# Patient Record
Sex: Male | Born: 1955 | Race: White | Hispanic: No | Marital: Married
Health system: Northeastern US, Academic
[De-identification: ages and names within clinical notes are randomized; demographics above are authoritative.]

---

## 2017-11-17 ENCOUNTER — Ambulatory Visit: Admit: 2017-11-17 | Payer: No Typology Code available for payment source

## 2017-11-17 ENCOUNTER — Ambulatory Visit: Admitting: Neurological Surgery

## 2017-11-17 NOTE — Progress Notes (Signed)
* * *        [name]    --- ---    62 Y old Male, DOB: 13-Nov-1955, External MRN: [id number]    Account Number: [id number]    [address] [place], [place]    Home: [phone]    Insurance: NHP OUT IPA Payer ID: PAPER    PCP: Verda Cumins, MD Referring: Verda Cumins, MD External Visit ID:  [id number]    Appointment Facility: Neurosurgery        * * *    11/17/2017  Progress Notes: [name], MD **CHN#:** [id number]    --- ---    ---         **Current Medications**    ---    Taking     * Aspirin 81 MG Tablet Chewable 1 tablet Orally Once a day    ---    * Calcium 600 MG Tablet 1 tablet Orally Twice a day    ---    * Enbrel 50 MG/ML Solution 1 ml Subcutaneous once a week on Friday    ---    * Fish Oil 1200 MG Capsule 1 capsule Orally Once a day    ---    * Gabapentin 300 MG Capsule Oral     ---    * Hydroxychloroquine Sulfate 200 MG Tablet TK 1 T PO Oral Once a day    ---    * PredniSONE 1 MG Tablet 4 tablets Orally Once a day    ---    * Prilosec 20 MG Capsule Delayed Release 1 capsule Orally Once a day    ---    * Tramadol HCl 50 MG Tablet (Schedule IV Drug) TK 1 T PO Q 8 H PRN P 4-6 Oral     ---    * Tylenol     ---    * Vitamin D 1000 UNIT Tablet 1 tablet Orally Once a day    ---    * Medication List reviewed and reconciled with the patient    ---      Past Medical History    ---       Rheumatoid arthritis.        ---       **Surgical History**    ---       Left shoulder    ---    Gallbladder    ---      **Social History**    ---    Tobacco history: Never smoked.      **Allergies**    ---       N.K.D.A.    ---    Forrestine Him Verified]       **Hospitalization/Major Diagnostic Procedure**    ---       Denies Past Hospitalization    ---       **Review of Systems**    ---     _GENERAL_ :    Negative for: weight loss within the past year,, weight gain within the past  year,, chest pain,, irregular heart beats,, heart murmurs,, nausea /  vomiting,, easy bruising,, fever / chills,,  frequent nose bleeds,, hoarse  voice,, frequent headaches,, shortness of breath,, breast discharge,, kidney  disease,, liver disease,, previous anesthesiea problems,, excess bleeding  during or after prior procedure,.    _NEUROLOGY_ :    Positive for: back pain. Negative for: weakness, ringing in the ears,, trouble  with balance and coordination,, hearing loss,, vision loss in one eye or  the  other,, difficulty swallowing,, difficulty swallowing,, double vision,, loss  of smell,.            **Reason for Appointment**    ---       1\. Low back pain and right leg pain    ---       **History of Present Illness**    ---     _NEUROSURGERY_ :    62 year old male presents in initial neurosurgical consultation with lower  back pain and right leg pain for the past six months. The pain is located in  the right buttocks (mostly buttocks), skips the thigh and then goes into the  lateral calf into the plantar surface of the foot to the plantar surface of  his right great toe. He actually tore two tendons in the left ankle getting up  from a rowing machine and was undergoing physical therapy for that. He wore a  boot and his gait was altered. He then developed low back pain and right leg  pain and has been going to physical therapy for that. He was initially  diagnosed with piriformis syndrome until MRI demonstrated the  spondylolisthesis, spinal stenosis, and synovial cyst compressing the right L5  nerve root. He did see a neurosurgeon at [facility] who recommended  fusion surgery. He is here for second opinion. He has history of rheumatoid  arthritis and was started on Enbrel 2-3 months ago. He also takes prednisone.  He has had PT for six months for his low back without relief. He cannot  tolerate NSAIDS due to his being on prednisone for his rheumatoid arthritis.     _Associated Providers_ :    Primary Care Provider Verda Cumins, MD .      **Vital Signs**    ---    Pain scale 5, Wt-lbs 200, BP 161/95, HR 102.        **Physical Examination**    ---     _NEUROSURGERY_ :    MOTOR Lower Extremities : 5/5 strength in the bilateral lower extremities  except:, Right EHL 4/5. Reflexes Left Knee Jerk 2+, Right Knee Jerk 2+, Left  Ankle Jerk 2+, Right Ankle Jerk 2+. Gait : uses a cane for assistance.  Babinski : Negative. Ankle Clonus : Negative.    _DIAGNOSTIC STUDIES_ :    RADIOLOGY I reviewed ap/lateral/flexion/extension xrays completed 11/17/17 at  [facility].Marland Kitchen MRI Uploaded to PACS. I reviewed MRI lumbar spine perfomed on 10/14/17  at [facility].          **Assessments**    ---    1\. Spondylolisthesis of lumbosacral region - M43.17 (Primary)    ---    2\. 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 There is also a synovial cyst  at right L5-S1 as well causing nerve compression. He has some weakness in the  left EHL as well. I discussed options with him including continued PT,  epidural steroid injections or surgical intervention. I believe that he would  benefit symptomatically from right L5-S1 TLIF (transforaminal lumbar interbody  fusion).    Dr. Melvenia Beam Helfgott informed patient that he would need to be off Enbrel for  one week prior to surgery and two weeks postoperatively. He told him that as  long as he is on less than 5 mg he can remain on prednisone throughout the  perioperative period.    His daughter is getting married on 02/20/18 in [place]. I recommended that he  undergo a lumbar epidural steroid injection in the meantime for his severe  right leg pain. I will refer him to Dr. Reece Leader in [place] for epidural  steroid injection. He will followup at least ten days after the injection to  see if it helps. If it doesn't will consider surgery.    ---       **Follow Up**    ---    after ESI    Electronically signed by [name] , MD on 11/17/2017 at 06:20 PM EDT    Sign off status: Completed        * *  *        Neurosurgery    [address] [place], [address]    [place], [place] [address]    Tel: [phone]    Fax: [phone]              * * *          Patient: BLAYDEN, CONWELL DOB: 1955-09-27 Progress Note: [name], MD  11/17/2017    ---    Note generated by eClinicalWorks EMR/PM Software ([email or web address])

## 2017-11-17 NOTE — Progress Notes (Signed)
.  Progress Notes  .  Patient: [name] [name]  Provider: [name] I   .  DOB: 1956/03/04 Age: 62 Y Sex: Male  .  PCP: Verda Cumins MD  Date: 11/17/2017  .  --------------------------------------------------------------------------------  .  REASON FOR APPOINTMENT  .  1. Low back pain and right leg pain  .  HISTORY OF PRESENT ILLNESS  .  NEUROSURGERY:  62 year old male presents in initial  neurosurgical consultation with lower back pain and right leg  pain for the past six months. The pain is located in the right  buttocks (mostly buttocks), skips the thigh and then goes into  the lateral calf into the plantar surface of the foot to the  plantar surface of his right great toe. He actually tore two  tendons in the left ankle getting up from a rowing machine and  was undergoing physical therapy for that. He wore a boot and his  gait was altered. He then developed low back pain and right leg  pain and has been going to physical therapy for that. He was  initially diagnosed with piriformis syndrome until MRI  demonstrated the spondylolisthesis, spinal stenosis, and synovial  cyst compressing the right L5 nerve root. He did see a  neurosurgeon at [facility] who recommended fusion  surgery. He is here for second opinion. He has history of  rheumatoid arthritis and was started on Enbrel 2-3 months ago. He  also takes prednisone. He has had PT for six months for his low  back without relief. He cannot tolerate NSAIDS due to his being  on prednisone for his rheumatoid arthritis.  .  Associated Providers:  Primary Care Provider  Verda Cumins, MD .  .  CURRENT MEDICATIONS  .  Taking Aspirin 81 MG Tablet Chewable 1 tablet Orally Once a day  Taking Calcium 600 MG Tablet 1 tablet Orally Twice a day  Taking Enbrel 50 MG/ML Solution 1 ml Subcutaneous once a week on  Friday  Taking Fish Oil 1200 MG Capsule 1 capsule Orally Once a day  Taking Gabapentin 300 MG Capsule Oral  Taking Hydroxychloroquine  Sulfate 200 MG Tablet TK 1 T PO Oral  Once a day  Taking PredniSONE 1 MG Tablet 4 tablets Orally Once a day  Taking Prilosec 20 MG Capsule Delayed Release 1 capsule Orally  Once a day  Taking Tramadol HCl 50 MG Tablet (Schedule IV Drug) TK 1 T PO Q 8  H PRN P 4-6 Oral  Taking Tylenol  Taking Vitamin D 1000 UNIT Tablet 1 tablet Orally Once a day  Medication List reviewed and reconciled with the patient  .  PAST MEDICAL HISTORY  .  Rheumatoid arthritis  .  ALLERGIES  .  N.K.D.A.  .  SURGICAL HISTORY  .  Left shoulder  Gallbladder  .  SOCIAL HISTORY  .  .  Tobaccohistory:Never smoked.  Marland Kitchen  HOSPITALIZATION/MAJOR DIAGNOSTIC PROCEDURE  .  Denies Past Hospitalization  .  REVIEW OF SYSTEMS  .  GENERAL:  .  Negative for:    weight loss within the past year,, weight gain  within the past year,, chest pain,, irregular heart beats,, heart  murmurs,, nausea / vomiting,, easy bruising,, fever / chills,,  frequent nose bleeds,, hoarse voice,, frequent headaches,,  shortness of breath,, breast discharge,, kidney disease,, liver  disease,, previous anesthesiea problems,, excess bleeding during  or after prior procedure, .  .  NEUROLOGY:  .  Positive for:  back pain . Negative for:    weakness, ringing  in the ears,, trouble with balance and coordination,, hearing  loss,, vision loss in one eye or the other,, difficulty  swallowing,, difficulty swallowing,, double vision,, loss of  smell, .  .  VITAL SIGNS  .  Pain scale 5, Wt-lbs 200, BP 161/95, HR 102.  Marland Kitchen  PHYSICAL EXAMINATION  .  NEUROSURGERY:  MOTOR Lower Extremities  : 5/5 strength in the bilateral lower  extremities except:, Right EHL 4/5. :5/5 strength in the  bilateral lower extremities except: , Right EHL4/5. Reflexes   Left Knee Jerk 2+, Right Knee Jerk 2+, Left Ankle Jerk 2+, Right  Ankle Jerk 2+. Left Knee Jerk2+ , Right Knee Jerk2+ , Left Ankle  Jerk2+ , Right Ankle Jerk2+. Gait  : uses a cane for assistance.  :uses a cane for assistance. Babinski  : Negative.  :Negative.  Ankle Clonus  : Negative. :Negative.  DIAGNOSTIC STUDIES:  RADIOLOGY  I reviewed ap/lateral/flexion/extension xrays  completed 11/17/17 at [facility].Marland Kitchen MRI  Uploaded to PACS. I reviewed  MRI lumbar spine perfomed on 10/14/17 at [facility].  .  ASSESSMENTS  .  Spondylolisthesis of lumbosacral region - M43.17 (Primary)  .  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  MRI lumbar spine demonstrates Grade II L5-S1 spondylolisthesis  causing severe compression of the right L5 nerve root. There is  also a synovial cyst at right L5-S1 as well causing nerve  compression. He has some weakness in the left EHL as well. I  discussed options with him including continued PT, epidural  steroid injections or surgical intervention. I believe that he  would benefit symptomatically from right L5-S1 TLIF  (transforaminal lumbar interbody fusion). Dr. Melvenia Beam Helfgott  informed patient that he would need to be off Enbrel for one week  prior to surgery and two weeks postoperatively. He told him that  as long as he is on less than 5 mg he can remain on prednisone  throughout the perioperative period. His daughter is getting  married on 02/20/18 in [place]. I recommended that he undergo a  lumbar epidural steroid injection in the meantime for his severe  right leg pain. I will refer him to Dr. Reece Leader in [place] for  epidural steroid injection. He will followup at least ten days  after the injection to see if it helps. If it doesn't will  consider surgery.  .  FOLLOW UP  .  after ESI  .  Electronically signed by [name] , MD on  11/17/2017 at 06:20 PM EDT  .  Document electronically signed by Dionisio David, RON I   .

## 2017-11-18 ENCOUNTER — Ambulatory Visit

## 2017-12-08 ENCOUNTER — Ambulatory Visit

## 2017-12-08 ENCOUNTER — Ambulatory Visit: Admitting: Specialist

## 2017-12-08 NOTE — Progress Notes (Signed)
.  Progress Notes  .  Patient: [name] [name]  Provider: Lorna Few    .  DOB: 1956-02-19 Age: 62 Y Sex: Male  .  PCP: Verda Cumins MD  Date: 12/08/2017  .  --------------------------------------------------------------------------------  .  REASON FOR APPOINTMENT  .  1. Spondylolisthesis of lumbosacral region  .  HISTORY OF PRESENT ILLNESS  .   :   [name] presents in initial consultation with  lower back pain and right leg pain for the past six months. The  pain is located mostly in the right buttocks , skips the thigh  and then goes into the lateral calf into the plantar surface of  the foot to the plantar surface of his right great toe. He  actually tore two tendons in the left ankle getting up from a  rowing machine and was undergoing physical therapy for that. He  wore a boot and his gait was altered. He then developed low back  pain and right leg pain and has been going to physical therapy  for that. He was initially diagnosed with piriformis syndrome  until MRI L-spine demonstrated the spondylolisthesis, spinal  stenosis, and synovial cyst compressing the right L5 nerve rootHe  did some PT with no improvement He was given gabapentin by PCP  for sleep at night but only took it few times ( just filled Rx)  and not constant.  .  CURRENT MEDICATIONS  .  Taking Aspirin 81 MG Tablet Chewable 1 tablet Orally Once a day  Taking Calcium 600 MG Tablet 1 tablet Orally Twice a day  Taking Enbrel 50 MG/ML Solution 1 ml Subcutaneous once a week on  Friday  Taking Fish Oil 1200 MG Capsule 1 capsule Orally Once a day  Taking Gabapentin 300 MG Capsule Oral , Notes: takes only when he  cannot sleept  Taking Hydroxychloroquine Sulfate 200 MG Tablet TK 1 T PO Oral  Once a day  Taking PredniSONE 1 MG Tablet 4 tablets Orally Once a day  Taking Prilosec 20 MG Capsule Delayed Release 1 capsule Orally  Once a day  Taking Tylenol  Taking Vitamin D 1000 UNIT Tablet 1 tablet Orally Once a  day  Not-Taking/PRN Tramadol HCl 50 MG Tablet (Schedule IV Drug) TK 1  T PO Q 8 H PRN P 4-6 Oral  Medication List reviewed and reconciled with the patient  .  PAST MEDICAL HISTORY  .  Rheumatoid arthritis  .  ALLERGIES  .  N.K.D.A.  .  SURGICAL HISTORY  .  Left shoulder  Gallbladder  .  FAMILY HISTORY  .  No Family History documented.  .  SOCIAL HISTORY  .  .  Tobaccohistory:Never smoked.  Marland Kitchen  HOSPITALIZATION/MAJOR DIAGNOSTIC PROCEDURE  .  No Hospitalization History.  Marland Kitchen  REVIEW OF SYSTEMS  .  Pain Management:  .  Constitutional:    Denies fever, chills . Cardiac:    Denies  chest pain . Pulmonary:    Denies shortness of breath . GI:     Denies bowel dysfunction . GU:    Denies bladder dysfunction .  Hematologic:    Denies anticoagulant therapy . Neurologic:     Denies numbness or weakness .  Marland Kitchen  VITAL SIGNS  .  Pain scale 6, Wt-lbs 191.2, BP 142/82, HR 57, Wt-kg 86.73, Wt  Change -8.8 lb, Temp 98.1, Oxygen sat % 95.  Marland Kitchen  PHYSICAL EXAMINATION  .  Pain Management:  Mental Status:  Speech, language, affect  and cognition are  normal. Motor:  There is normal muscle tone. Strength is 5/5 in  the deltoids, biceps, brachioradialis, triceps, grip strength,  intrinsic hand muscles, iliopsoas, quadriceps, anterior tibialis,  EHL, foot everters and gastrocnemius. Sensory:  Sensation is  intact to light touch in all sensory dermatomes in upper and  lower extremities. Reflexes:  The biceps, triceps, knee reflexes  are 2+ bilaterally and symmetric, left ankel deferred , right  0-1. Gait:  antalgic, using a cane. Musculoskeletal Lumbar Spine:   Moderate tenderness to palpation in right lumbar paravertebral  musculature. Good ROM of lumbar spine but with pain with forward  flexion, extension, lateral flexion. Positive right straight leg  raising. No pain to palpation over SI joints. No gross asymmetric  muscular atrophy noted in lower extremities. Good ROM noted in  all joints in lower extremities.  .  ASSESSMENTS  .  Right lumbar  radiculopathy - M54.16 (Primary)  .  TREATMENT  .  Right lumbar radiculopathy  Notes: [name] with right lumbar radiculopathy -  options of addressing his pain discussed. He is planning surgery  in the fall but would like to wait until after his daughter's  weddingRestart gabapentin ( as he has medication at home, not  sure the dosage ) - at 300 mg at bedtime . Goals and expectations  along with possible side effects discussedLESI discussed in  detail and planned ( at [facility]) .  Marland Kitchen  FOLLOW UP  .  2 Weeks (Reason: L5S1 ESI ( right paramedian approach ) )  .  Electronically signed by Lorna Few , MD on  12/08/2017 at 11:57 AM EDT  .  Document electronically signed by Lorna Few    .

## 2017-12-08 NOTE — Progress Notes (Signed)
* * *        [name] [name]    --- ---    62 Y old Male, DOB: July 16, 1955, External MRN: [id number]    Account Number: [id number]    [address], [name], [place]    Home: [phone]    Insurance: NHP OUT IPA    PCP: [name] Cumins, MD Referring: [name] Cumins, MD    Appointment Facility: [facility]        * * *    12/08/2017  Progress Notes: [name] [name]. [name] Few, MD **CHN#:** [id number]    --- ---    ---         **Reason for Appointment**    ---       1\. Spondylolisthesis of lumbosacral region    ---       **History of Present Illness**    ---     __ :    8 62 yo gentleman presents in initial consultation with lower back pain  and right leg pain for the past six months. The pain is located mostly in the  right buttocks , skips the thigh and then goes into the lateral calf into the  plantar surface of the foot to the plantar surface of his right great toe. He  actually tore two tendons in the left ankle getting up from a rowing machine  and was undergoing physical therapy for that. He wore a boot and his gait was  altered. He then developed low back pain and right leg pain and has been going  to physical therapy for that. He was initially diagnosed with piriformis  syndrome until MRI L-spine demonstrated the spondylolisthesis, spinal  stenosis, and synovial cyst compressing the right L5 nerve root    He did some PT with no improvement    He was given gabapentin by PCP for sleep at night but only took it [name] times (  just filled Rx) and not constant.       **Current Medications**    ---    Taking     * Aspirin 81 MG Tablet Chewable 1 tablet Orally Once a day    ---    * Calcium 600 MG Tablet 1 tablet Orally Twice a day    ---    * Enbrel 50 MG/ML Solution 1 ml Subcutaneous once a week on Friday    ---    * Fish Oil 1200 MG Capsule 1 capsule Orally Once a day    ---    * Gabapentin 300 MG Capsule Oral , Notes: takes only when he cannot sleept    ---    * Hydroxychloroquine Sulfate 200  MG Tablet TK 1 T PO Oral Once a day    ---    * PredniSONE 1 MG Tablet 4 tablets Orally Once a day    ---    * Prilosec 20 MG Capsule Delayed Release 1 capsule Orally Once a day    ---    * Tylenol     ---    * Vitamin D 1000 UNIT Tablet 1 tablet Orally Once a day    ---    Not-Taking/PRN    * Tramadol HCl 50 MG Tablet (Schedule IV Drug) TK 1 T PO Q 8 H PRN P 4-6 Oral     ---    * Medication List reviewed and reconciled with the patient    ---       **Past Medical History**    ---  Rheumatoid arthritis.        ---       **Surgical History**    ---       Left shoulder    ---    Gallbladder    ---      **Family History**    ---       No Family History documented.    ---       **Social History**    ---    Tobacco  history: Never smoked.      **Allergies**    ---       N.K.D.A.    ---       **Hospitalization/Major Diagnostic Procedure**    ---       No Hospitalization History.    ---       **Review of Systems**    ---     _Pain Management_ :    Constitutional: Denies fever, chills. Cardiac: Denies chest pain. Pulmonary:  Denies shortness of breath. GI: Denies bowel dysfunction. GU: Denies bladder  dysfunction. Hematologic: Denies anticoagulant therapy. Neurologic: Denies  numbness or weakness.          **Vital Signs**    ---    Pain scale 6, Wt-lbs 191.2, BP 142/82, HR 57, Wt-kg 86.73, Wt Change -8.8 lb,  Temp 98.1, Oxygen sat % 95.       **Physical Examination**    ---     _Pain Management_ :    Mental Status: Speech, language, affect and cognition are normal. Motor: There  is normal muscle tone. Strength is 5/5 in the deltoids, biceps,  brachioradialis, triceps, grip strength, intrinsic hand muscles, iliopsoas,  quadriceps, anterior tibialis, EHL, foot everters and gastrocnemius. Sensory:  Sensation is intact to light touch in all sensory dermatomes in upper and  lower extremities. Reflexes: The biceps, triceps, knee reflexes are 2+  bilaterally and symmetric, left ankel deferred , right 0-1. Gait:  antalgic,  using a cane. Musculoskeletal Lumbar Spine: Moderate tenderness to palpation  in right lumbar paravertebral musculature. Good ROM of lumbar spine but with  pain with forward flexion, extension, lateral flexion. Positive right straight  leg raising. No pain to palpation over SI joints. No gross asymmetric muscular  atrophy noted in lower extremities. Good ROM noted in all joints in lower  extremities.          **Assessments**    ---    1\. Right lumbar radiculopathy - M54.16 (Primary)    ---       **Treatment**    ---       **1\. Right lumbar radiculopathy**    Notes: [name] with right lumbar radiculopathy - options of  addressing his pain discussed. He is planning surgery in the fall but would  like to wait until after his daughter's wedding    Restart gabapentin ( as he has medication at home, not sure the dosage ) - at  300 mg at bedtime . Goals and expectations along with possible side effects  discussed    LESI discussed in detail and planned ( at [facility])    .    ---      **Follow Up**    ---    2 Weeks (Reason: L5S1 ESI ( right paramedian approach ) )    Electronically signed by [name] [name] , MD on 12/08/2017 at 11:57 AM EDT    Sign off status: Completed        * * *  [facility]    [id number]    [place], [place] [address]    Tel: [phone]    Fax: [phone]              * * *          Patient: [name] [name], [name] [name] DOB: [birth year] Progress Note: [name] [name]. [name] Few, MD 12/08/2017    ---    Note generated by eClinicalWorks EMR/PM Software ([email or web address])

## 2017-12-08 NOTE — Progress Notes (Signed)
* * *        **  [name] [name]**    --- ---    42 Y old Male, DOB: 1956/01/03    [address], [place], [place] [address]    Home: [phone]    Provider: Lorna Few        * * *    Telephone Encounter    ---    Answered by   Lorna Few  Date: 12/08/2017         Time: 11:59 AM    Message                      Asencion Islam , could you please schedule [name] for a LESI in [facility] at the end of the month       Best phone [phone]      Thank you!        --- ---            Action Taken                      Carold Eisner,ADRIANA  12/08/2017 12:00:09 PM >       Deloney,Marva  12/08/2017 1:31:39 PM > Appt scheduled for 12/22/17 at 9:30 am - voice mail message left.                    * * *                ---          * * *          Patient: [name] [name] DOB: 21-Jul-1955 Provider: Lorna Few  12/08/2017    ---    Note generated by eClinicalWorks EMR/PM Software ([email or web address])

## 2017-12-09 ENCOUNTER — Ambulatory Visit

## 2017-12-22 ENCOUNTER — Ambulatory Visit: Admitting: Specialist

## 2017-12-22 NOTE — Progress Notes (Signed)
.  Progress Notes  .  Patient: [name] [name]  Provider: Lorna Few    .  DOB: 12/26/1955 Age: 62 Y Sex: Male  .  PCP: Verda Cumins MD  Date: 12/22/2017  .  --------------------------------------------------------------------------------  .  REASON FOR APPOINTMENT  .  1. MAD LESI PER DR DESILLIER  .  HISTORY OF PRESENT ILLNESS  .  GENERAL:   [name] here today for planned LESI to address  his radicular pain .Details and expectations discussed again .He  is planning surgery but not until fall ( after his daughter's  wedding ).  .  CURRENT MEDICATIONS  .  Taking Aspirin 81 MG Tablet Chewable 1 tablet Orally Once a day  Taking Calcium 600 MG Tablet 1 tablet Orally Twice a day  Taking Enbrel 50 MG/ML Solution 1 ml Subcutaneous once a week on  Friday  Taking Fish Oil 1200 MG Capsule 1 capsule Orally Once a day  Taking Gabapentin 300 MG Capsule Oral , Notes: takes only when he  cannot sleept  Taking Hydroxychloroquine Sulfate 200 MG Tablet TK 1 T PO Oral  Once a day  Taking PredniSONE 1 MG Tablet 4 tablets Orally Once a day  Taking Prilosec 20 MG Capsule Delayed Release 1 capsule Orally  Once a day  Taking Tylenol  Taking Vitamin D 1000 UNIT Tablet 1 tablet Orally Once a day  Not-Taking/PRN Tramadol HCl 50 MG Tablet (Schedule IV Drug) TK 1  T PO Q 8 H PRN P 4-6 Oral  .  ALLERGIES  .  yes[Allergies Verified]  .  VITAL SIGNS  .  Wt-lbs 190.26, BP 140/83, Wt-kg 86.3, Pulse sitting 56.  Marland Kitchen  PHYSICAL EXAMINATION  .  Pain Management:  Pre-Procedure Exam:  no erythema noted at injection site.  .  ASSESSMENTS  .  Right lumbar radiculopathy - M54.16 (Primary)  .  PROCEDURES  .  Lumbar ESI     LESI #1 .Details of the procedure as well as risks and  benefits were discussed with patient at length. Written informed  consent was obtained. Standard monitors were applied and the  patient was monitored and stable all throughout the procedure.  With the patient in the prone position, the backwas prepped  with  chlorhexidine and draped in the usual sterile fashion. The L5-S1  interspace was identified fluoroscopically and 1% Lidocaine was  infiltrated into the skin right paramedian for local anesthesia.  A 20 gauge epidural needle was introduced and gradually advanced  under intermittent fluoroscopy guidance towards prior identified  target area. When the needle was felt to be in the ligamentym  flavum loss of resistance to saline technique was introduced .  The epidural space was accessed at 6 cm and proper needle  positioning was reconfirmed with lateral flouroscopy view.A  solution 2 cc of Triamcinolone (80 mg) , 1 cc 0.25% Bupivacaine  and 2 cc PFNS was then injected into the epidural space after  negative aspiration. The needle was removed. The patient  tolerated the procedure well. There was no evidence of  cerebrospinal fluid, paresthesia or heme. Patient was monitored  for additional 5-10 minutes, remained hemodinamically and  neurologically stable and was discharged to home with a ride.  Written and verbal instructions were given to the patient prior  to depart from the clinic.  Marland Kitchen  PROCEDURE CODES  .  [address] INJ INC NEEDLE CATH PLACE,INTERLAM  EPIDUR/SUBARAC,LUMB/SACRAL;W/ IMAGE GUIDE  .  8170 PAI TRIAMCINOLONE ACET INJ 10MG  - J3301,  Units: 8.00  .  FOLLOW UP  .  3 Weeks (Reason: follow up in [facility])  .  Electronically signed by Lorna Few , MD on  12/22/2017 at 09:42 AM EDT  .  Document electronically signed by Lorna Few    .

## 2017-12-22 NOTE — Progress Notes (Signed)
* * *        [name] [name]    --- ---    62 Y old Male, DOB: April 25, 1956, External MRN: [id number]    Account Number: [id number]    [name], [place], [place]    Home: [phone]    Insurance: NHP OUT IPA    PCP: Verda Cumins, MD Referring: Verda Cumins, MD    Appointment Facility: [facility]        * * *    12/22/2017  Progress Notes: [name] [name]. [name] Few, MD **CHN#:** [id number]    --- ---    ---         **Reason for Appointment**    ---       1\. MAD LESI PER DR Lulamae Skorupski    ---       **History of Present Illness**    ---     _GENERAL_ :    [name] here today for planned LESI to address his radicular  pain .Details and expectations discussed again .He is planning surgery but not  until fall ( after his daughter's wedding ).       **Current Medications**    ---    Taking     * Aspirin 81 MG Tablet Chewable 1 tablet Orally Once a day    ---    * Calcium 600 MG Tablet 1 tablet Orally Twice a day    ---    * Enbrel 50 MG/ML Solution 1 ml Subcutaneous once a week on Friday    ---    * Fish Oil 1200 MG Capsule 1 capsule Orally Once a day    ---    * Gabapentin 300 MG Capsule Oral , Notes: takes only when he cannot sleept    ---    * Hydroxychloroquine Sulfate 200 MG Tablet TK 1 T PO Oral Once a day    ---    * PredniSONE 1 MG Tablet 4 tablets Orally Once a day    ---    * Prilosec 20 MG Capsule Delayed Release 1 capsule Orally Once a day    ---    * Tylenol     ---    * Vitamin D 1000 UNIT Tablet 1 tablet Orally Once a day    ---    Not-Taking/PRN    * Tramadol HCl 50 MG Tablet (Schedule IV Drug) TK 1 T PO Q 8 H PRN P 4-6 Oral     ---      **Vital Signs**    ---    Wt-lbs 190.26, BP 140/83, Wt-kg 86.3, Pulse sitting 56.       **Physical Examination**    ---     _Pain Management_ :    Pre-Procedure Exam: no erythema noted at injection site.          **Assessments**    ---    1\. Right lumbar radiculopathy - M54.16 (Primary)    ---       **Procedures**    ---     Salli Real  ESI_ :    LESI #1 .Details of the procedure as well as risks and benefits were discussed  with patient at length. Written informed consent was obtained. Standard  monitors were applied and the patient was monitored and stable all throughout  the procedure. With the patient in the prone position, the backwas prepped  with chlorhexidine and draped in the usual sterile fashion. The L5-S1  interspace was  identified fluoroscopically and 1% Lidocaine was infiltrated  into the skin right paramedian for local anesthesia. A 20 gauge epidural  needle was introduced and gradually advanced under intermittent fluoroscopy  guidance towards prior identified target area. When the needle was felt to be  in the ligamentym flavum loss of resistance to saline technique was introduced  . The epidural space was accessed at 6 cm and proper needle positioning was  reconfirmed with lateral flouroscopy view.A solution 2 cc of Triamcinolone (80  mg) , 1 cc 0.25% Bupivacaine and 2 cc PFNS was then injected into the epidural  space after negative aspiration. The needle was removed. The patient tolerated  the procedure well. There was no evidence of cerebrospinal fluid, paresthesia  or heme. Patient was monitored for additional 5-10 minutes, remained  hemodinamically and neurologically stable and was discharged to home with a  ride. Written and verbal instructions were given to the patient prior to  depart from the clinic..          **Procedure Codes**    ---       [address] INJ INC NEEDLE CATH PLACE,INTERLAM EPIDUR/SUBARAC,LUMB/SACRAL;W/  IMAGE GUIDE    ---    8170 PAI TRIAMCINOLONE ACET INJ 10MG  - J3301, Units: 8.00    ---      **Follow Up**    ---    3 Weeks (Reason: follow up in [facility]PC)    Electronically signed by [name] [name] , MD on 12/22/2017 at 09:42 AM EDT    Sign off status: Completed        * * *        [name]    [address]    [address]    [place], [place] [address]    Tel: [phone]    Fax: [phone]              * * *           Patient: [name] [name], [name] [name] DOB: 64-Feb-1957 Progress Note: [name] [name]. [name] Few, MD 64/25/2019    ---    Note generated by eClinicalWorks EMR/PM Software ([email or web address])

## 2018-01-12 ENCOUNTER — Ambulatory Visit: Admitting: Specialist

## 2018-01-12 ENCOUNTER — Ambulatory Visit

## 2018-01-12 NOTE — Progress Notes (Signed)
* * *        **  [name] [name]**    --- ---    56 Y old Male, DOB: 1955-07-26    [address] [place], [place] [address]    Home: [phone]    Provider: Lorna Few        * * *    Telephone Encounter    ---    Answered by   Lorna Few  Date: 01/12/2018         Time: 11:22 AM    Reason   injection appt    --- ---            Message                      Asencion Islam, could you please schedule [name] at [place] next week 9 8/22 ) for bilateral MBb 2 levels. Thank you!                Action Taken                      Aloha Bartok,ADRIANA  01/12/2018 11:22:58 AM >       Correia,Jennifer  01/16/2018 1:44:57 PM > patient call back looking to see if this was confirmed or not can someone please call him? cb#[phone]      Correia,Jennifer  01/18/2018 2:04:16 PM > pt called again, new encounter sent to [facility].                     * * *                ---          * * *          Patient: [name] [name] DOB: 30-Jun-1955 Provider: Lorna Few  01/12/2018    ---    Note generated by eClinicalWorks EMR/PM Software ([email or web address])

## 2018-01-12 NOTE — Progress Notes (Signed)
* * *        [name] [name]    --- ---    62 Y old Male, DOB: 25-Sep-1955, External MRN: [id number]    Account Number: [id number]    [address] [place], [place]    Home: [phone]    Insurance: NHP OUT IPA    PCP: Verda Cumins, MD Referring: Verda Cumins, MD    Appointment Facility: [facility]        * * *    01/12/2018  Progress Notes: [name]. Lorna Few, MD **CHN#:** [id number]    --- ---    ---         **Reason for Appointment**    ---       1\. Right lumbar radiculopathy    ---       **History of Present Illness**    ---     _GENERAL_ :    62 yo gentleman with low back and radicular pain , underwent LESI about 3  weeks ago with complete resolution of his radicular pain ( and improvement in  the known ankle pain and swelling)    He has some low back , axial pain that seems facet mediated and I offered him  MBbs.       **Current Medications**    ---    Taking     * Aspirin 81 MG Tablet Chewable 1 tablet Orally Once a day    ---    * Calcium 600 MG Tablet 1 tablet Orally Twice a day    ---    * Enbrel 50 MG/ML Solution 1 ml Subcutaneous once a week on Friday    ---    * Fish Oil 1200 MG Capsule 1 capsule Orally Once a day    ---    * Gabapentin 300 mg Capsule 1 capsule Oral hs, Notes: takes only when he cannot sleept    ---    * Hydroxychloroquine Sulfate 200 MG Tablet TK 1 T PO Oral Once a day    ---    * PredniSONE 1 MG Tablet 4 tablets Orally Once a day, Notes: alternates 4/5 mg qod    ---    * Prilosec 20 MG Capsule Delayed Release 1 capsule Orally Once a day    ---    * Vitamin D 1000 UNIT Tablet 1 tablet Orally Once a day    ---    Not-Taking/PRN    * Tramadol HCl 50 MG Tablet (Schedule IV Drug) TK 1 T PO Q 8 H PRN P 4-6 Oral     ---    * Tylenol     ---    * Medication List reviewed and reconciled with the patient    ---       **Past Medical History**    ---       Rheumatoid arthritis.        ---       **Surgical History**    ---       Left shoulder    ---    Gallbladder     ---      **Family History**    ---       No Family History documented.    ---       **Social History**    ---    Tobacco  history: Never smoked.      **Allergies**    ---       N.K.D.A.    ---       **  Hospitalization/Major Diagnostic Procedure**    ---       No Hospitalization History.    ---       **Review of Systems**    ---     _Pain Management_ :    Constitutional: Denies fever, chills. Cardiac: Denies chest pain. Pulmonary:  Denies shortness of breath. GI: Denies bowel dysfunction. GU: Denies bladder  dysfunction. Hematologic: Denies anticoagulant therapy. Neurologic: Denies  numbness or weakness.          **Vital Signs**    ---    Pain scale 3, Ht-in 68.0, Wt-lbs 195.6, BMI 29.74, BP 122/88, HR 62, Ht-cm  172.72, Wt-kg 88.72, Wt Change 5.34 lb, Temp 97.9, Oxygen sat % 94.       **Physical Examination**    ---     _Pain Management_ :    Mental Status: Speech, language, affect and cognition are normal. Motor: There  is normal muscle tone. Strength is 5/5 in the deltoids, biceps,  brachioradialis, triceps, grip strength, intrinsic hand muscles, iliopsoas,  quadriceps, anterior tibialis, EHL, foot everters and gastrocnemius. Sensory:  Sensation is intact to light touch in all sensory dermatomes in upper and  lower extremities. Reflexes: The biceps, triceps, knee reflexes are 2+  bilaterally and symmetric, left ankel deferred , right 0-1. Gait: Gait is  normal. Heel to toe walking is normal ( not using a cane anylonger) .  Musculoskeletal Lumbar Spine: Moderate tenderness to palpation in right lumbar  paravertebral musculature. Good ROM of lumbar spine but with pain with forward  flexion, extension, lateral flexion.POsitive facet tenderness and load  bilateral L45, L5S1 . Negative right straight leg raising. No pain to  palpation over SI joints. No gross asymmetric muscular atrophy noted in lower  extremities. Good ROM noted in all joints in lower extremities.          **Assessments**    ---    1\.  Spondylolisthesis of lumbosacral region - M43.17 (Primary)    ---    2\. Right lumbar radiculopathy - M54.16    ---    3\. Low back pain - M54.5    ---    4\. Other chronic pain - G89.29    ---       **Treatment**    ---       **1\. Spondylolisthesis of lumbosacral region**    Notes: MBBs discussed in detail and planned for next week    Radicular pain resolved ( ankle pain improved also )    He is much better, went back to rowing and walking the dog.    ---      **Follow Up**    ---    1 Week (Reason: MBb bilateral L45, L5S1)    Electronically signed by Lorna Few , MD on 01/12/2018 at 11:21 AM EDT    Sign off status: Completed        * * [facility]    [place], [place] [address]    Tel: [phone]    Fax: [phone]              * * *          Patient: [name] [name] DOB: Jul 23, 1955 Progress Note: Kandice Schmelter. Lorna Few, MD 01/12/2018    ---    Note generated by eClinicalWorks EMR/PM Software ([email or web address])

## 2018-01-12 NOTE — Progress Notes (Signed)
.  Progress Notes  .  Patient: [name] [name]  Provider: Lorna Few    .  DOB: 1955-11-15 Age: 62 Y Sex: Male  .  PCP: Verda Cumins MD  Date: 01/12/2018  .  --------------------------------------------------------------------------------  .  REASON FOR APPOINTMENT  .  1. Right lumbar radiculopathy  .  HISTORY OF PRESENT ILLNESS  .  GENERAL:   62 yo gentleman with low back and radicular pain , underwent  LESI about 3 weeks ago with complete resolution of his radicular  pain ( and improvement in the known ankle pain and swelling) He  has some low back , axial pain that seems facet mediated and I  offered him MBbs.  .  CURRENT MEDICATIONS  .  Taking Aspirin 81 MG Tablet Chewable 1 tablet Orally Once a day  Taking Calcium 600 MG Tablet 1 tablet Orally Twice a day  Taking Enbrel 50 MG/ML Solution 1 ml Subcutaneous once a week on  Friday  Taking Fish Oil 1200 MG Capsule 1 capsule Orally Once a day  Taking Gabapentin 300 mg Capsule 1 capsule Oral hs, Notes: takes  only when he cannot sleept  Taking Hydroxychloroquine Sulfate 200 MG Tablet TK 1 T PO Oral  Once a day  Taking PredniSONE 1 MG Tablet 4 tablets Orally Once a day, Notes:  alternates 4/5 mg qod  Taking Prilosec 20 MG Capsule Delayed Release 1 capsule Orally  Once a day  Taking Vitamin D 1000 UNIT Tablet 1 tablet Orally Once a day  Not-Taking/PRN Tramadol HCl 50 MG Tablet (Schedule IV Drug) TK 1  T PO Q 8 H PRN P 4-6 Oral  Not-Taking/PRN Tylenol  Medication List reviewed and reconciled with the patient  .  PAST MEDICAL HISTORY  .  Rheumatoid arthritis  .  ALLERGIES  .  N.K.D.A.  .  SURGICAL HISTORY  .  Left shoulder  Gallbladder  .  FAMILY HISTORY  .  No Family History documented.  .  SOCIAL HISTORY  .  .  Tobaccohistory:Never smoked.  Marland Kitchen  HOSPITALIZATION/MAJOR DIAGNOSTIC PROCEDURE  .  No Hospitalization History.  Marland Kitchen  REVIEW OF SYSTEMS  .  Pain Management:  .  Constitutional:    Denies fever, chills . Cardiac:    Denies  chest pain . Pulmonary:     Denies shortness of breath . GI:     Denies bowel dysfunction . GU:    Denies bladder dysfunction .  Hematologic:    Denies anticoagulant therapy . Neurologic:     Denies numbness or weakness .  Marland Kitchen  VITAL SIGNS  .  Pain scale 3, Ht-in 68.0, Wt-lbs 195.6, BMI 29.74, BP 122/88, HR  62, Ht-cm 172.72, Wt-kg 88.72, Wt Change 5.34 lb, Temp 97.9,  Oxygen sat % 94.  Marland Kitchen  PHYSICAL EXAMINATION  .  Pain Management:  Mental Status:  Speech, language, affect and cognition are  normal. Motor:  There is normal muscle tone. Strength is 5/5 in  the deltoids, biceps, brachioradialis, triceps, grip strength,  intrinsic hand muscles, iliopsoas, quadriceps, anterior tibialis,  EHL, foot everters and gastrocnemius. Sensory:  Sensation is  intact to light touch in all sensory dermatomes in upper and  lower extremities. Reflexes:  The biceps, triceps, knee reflexes  are 2+ bilaterally and symmetric, left ankel deferred , right  0-1. Gait:  Gait is normal. Heel to toe walking is normal ( not  using a cane anylonger) . Musculoskeletal Lumbar Spine:  Moderate  tenderness to palpation in right  lumbar paravertebral  musculature. Good ROM of lumbar spine but with pain with forward  flexion, extension, lateral flexion.POsitive facet tenderness and  load bilateral L45, L5S1 . Negative right straight leg raising.  No pain to palpation over SI joints. No gross asymmetric muscular  atrophy noted in lower extremities. Good ROM noted in all joints  in lower extremities.  .  ASSESSMENTS  .  Spondylolisthesis of lumbosacral region - M43.17 (Primary)  .  Right lumbar radiculopathy - M54.16  .  Low back pain - M54.5  .  Other chronic pain - G89.29  .  TREATMENT  .  Spondylolisthesis of lumbosacral region  Notes: MBBs discussed in detail and planned for next week  Radicular pain resolved ( ankle pain improved also ) He is much  better, went back to rowing and walking the dog.  .  FOLLOW UP  .  1 Week (Reason: MBb bilateral L45, L5S1)  .  Electronically signed  by Lorna Few , MD on  01/12/2018 at 11:21 AM EDT  .  Document electronically signed by Lorna Few    .

## 2018-01-18 ENCOUNTER — Ambulatory Visit: Admitting: Specialist

## 2018-01-18 NOTE — Progress Notes (Signed)
* * *        **  [name] [name]**    --- ---    52 Y old Male, DOB: May 04, 1956    [address] [place], [place] [address]    Home: [phone]    Provider: Lorna Few        * * *    Telephone Encounter    ---    Answered by   Vladimir Faster  Date: 01/18/2018         Time: 02:02 PM    Caller   pt    --- ---            Reason   inj appt            Message                      marva never got back to pt about injectinon appt. I do not know what information to give the pt. can you please call him back. CB# [phone].                 Action Taken                      Deloney,Marva  01/18/2018 3:02:38 PM > I spoke to the patient and explained that the injection he is seeking to get requires authorization.  As soon as we get it authorized, he will get a call for an appt.      Anastas,Mary  01/18/2018 3:08:02 PM > Thank you Marva.                    * * *                ---          * * *          Patient: [name] [name] DOB: Dec 27, 1955 Provider: Lorna Few  01/18/2018    ---    Note generated by eClinicalWorks EMR/PM Software ([email or web address])

## 2018-01-25 ENCOUNTER — Ambulatory Visit: Admitting: Specialist

## 2018-01-25 NOTE — Progress Notes (Signed)
* * *        **  [name] [name]**    --- ---    1 Y old Male, DOB: 1956-05-23    [address], [place], [place] [address]    Home: [phone]    Provider: Lorna Few        * * *    Telephone Encounter    ---    Answered by   Quay Burow  Date: 01/25/2018         Time: 03:50 PM    Caller   Patient    --- ---            Reason   Injection approval            Message                      Patient is calling to check if his injections have been approved yet (please view prev encounters).       CB#: [phone]                Action Taken                      Glori Bickers  01/27/2018 5:28:15 PM > Left voicemail that injection approved and will be done at [facility] on Thursday, February 16, 2018.  [place] office will call patient with the time of appointment.                    * * *                ---          * * *          Patient: [name] [name] DOB: 21-Dec-1955 Provider: Lorna Few  01/25/2018    ---    Note generated by eClinicalWorks EMR/PM Software ([email or web address])

## 2018-02-09 ENCOUNTER — Ambulatory Visit: Admit: 2018-02-09 | Payer: No Typology Code available for payment source

## 2018-02-09 ENCOUNTER — Ambulatory Visit: Admitting: Neurological Surgery

## 2018-02-09 NOTE — Progress Notes (Signed)
.  Progress Notes  .  Patient: [name] [name]  Provider: [name] I   .  DOB: Nov 25, 1955 Age: 62 Y Sex: Male  .  PCP: Verda Cumins MD  Date: 02/09/2018  .  --------------------------------------------------------------------------------  .  REASON FOR APPOINTMENT  .  1. Low back pain and right leg pain  .  HISTORY OF PRESENT ILLNESS  .  NEUROSURGERY:  62 year old male presents in  followup with lower back pain and right leg pain for the past 9  months. The pain is located in the right buttocks (mostly  buttocks), skips the thigh and then goes into the lateral calf  into the plantar surface of the foot to the plantar surface of  his right great toe. The patient reports that he currently doing  a HEP for his right foot at this time. He then developed low back  pain and right leg pain and has been going to physical therapy  for that. He was initially diagnosed with piriformis syndrome  until MRI demonstrated the spondylolisthesis, spinal stenosis,  and synovial cyst compressing the right L5 nerve root. He has  history of rheumatoid arthritis and was started on Enbrel 2-3  months ago. He also takes prednisone. He has had PT for six  months for his low back without relief. He cannot tolerate NSAIDS  due to his being on prednisone for his rheumatoid arthritis. He  underwent lumbar L5-S1 epidural steroid injection on 12/22/17 by  Dr. Reece Leader. The patient reports that after this injection his  right leg symptoms were improved for 2+ weeks. Today the patient  reports that his right leg pain continues to be slightly  improved, however he continues to have lower back pain. His life  is still very limited by his symptoms; he lives in a 2 story  house and sleeps in his recliner 2+ nights/week due severe pain  with ambulating stairs. He is here today to discuss surgery. The  patient had torn tendons on his left ankle; he is currently  wearing an AFO. He wears this when he is out of the house. He was  told that he  may need a left ankle fusion.  .  Associated Providers:  Primary Care Provider  Verda Cumins, MD .  .  CURRENT MEDICATIONS  .  Taking Aspirin 81 MG Tablet Chewable 1 tablet Orally Once a day  Taking Calcium 600 MG Tablet 1 tablet Orally Twice a day  Taking Enbrel 50 MG/ML Solution 1 ml Subcutaneous once a week on  Friday  Taking Fish Oil 1200 MG Capsule 1 capsule Orally Once a day  Taking Gabapentin 300 mg Capsule 1 capsule Oral hs, Notes: takes  only when he cannot sleept  Taking Hydroxychloroquine Sulfate 200 MG Tablet TK 1 T PO Oral  Once a day  Taking PredniSONE 1 MG Tablet 4 tablets Orally Once a day, Notes:  alternates 4/5 mg qod  Taking Prilosec 20 MG Capsule Delayed Release 1 capsule Orally  Once a day  Taking Tramadol HCl 50 MG Tablet (Schedule IV Drug) TK 1 T PO Q 8  H PRN P 4-6 Oral  Taking Tylenol  Taking Vitamin D 1000 UNIT Tablet 1 tablet Orally Once a day  Medication List reviewed and reconciled with the patient  .  PAST MEDICAL HISTORY  .  Rheumatoid arthritis  .  ALLERGIES  .  N.K.D.A.  .  SURGICAL HISTORY  .  Left shoulder  Gallbladder  .  SOCIAL HISTORY  .  .  Tobaccohistory:Never smoked.  Marland Kitchen  HOSPITALIZATION/MAJOR DIAGNOSTIC PROCEDURE  .  Denies Past Hospitalization  .  VITAL SIGNS  .  Pain scale 3, Ht-in 68.0, Wt-lbs 195, BMI 29.65.  Marland Kitchen  PHYSICAL EXAMINATION  .  NEUROSURGERY:  MOTOR Lower Extremities  : 5/5 strength in the bilateral lower  extremities except:, Right EHL 4/5. :5/5 strength in the  bilateral lower extremities except: , Right EHL4/5. Reflexes   Left Knee Jerk 2+, Right Knee Jerk 2+, Left Ankle Jerk 2+, Right  Ankle Jerk 2+. Left Knee Jerk2+ , Right Knee Jerk2+ , Left Ankle  Jerk2+ , Right Ankle Jerk2+. Gait  : previously used a cane  (stopped using since his last appointment). : previously used a  cane (stopped using since his last appointment). Babinski  :  Negative. :Negative. Ankle Clonus  : Negative. :Negative.  DIAGNOSTIC STUDIES:  RADIOLOGY  I reviewed lumbar spine MRI  completed 09/28/17 at [facility].I reviewed lumbar spine xrays that were completed  on 11/17/17 at [facility]. Marland Kitchen  AFO LLE.  Marland Kitchen  ASSESSMENTS  .  Lumbar radiculopathy - M54.16 (Primary)  .  Spondylolisthesis, lumbar region - M43.16  .  Reviewing his lumbar spine MRI at L5-S1 there is  spondylolisthesis with a stress fracture. There are degenerative  changes at multiple levels in the lumbar spine (most likely  related to RA). The patient has not had lasting improvement in  his symptoms with lumbar PT and injections. He would like to  pursue surgery at this time. I offere option of a L5-S1 TLIF.  This surgery was discussed with the patient and his wife.  Surgical consent signed. His daugther is getting married at the  end of September 2019 and he would like surgery after this  date.The patient will need a pre-op surgical planning lumbar CT  prior to surgery.  .  FOLLOW UP  .  schedule surgery  .  Electronically signed by [name] , MD on  02/09/2018 at 03:44 PM EDT  .  Document electronically signed by Dionisio David, RON I   .

## 2018-02-09 NOTE — Progress Notes (Signed)
* * *        [name] [name]    --- ---    42 Y old Male, DOB: [birth year], External MRN: [id number]    Account Number: [id number]    [address], [place], [place]    Home: [phone]    Insurance: NHP OUT IPA Payer ID: PAPER    PCP: Verda Cumins, MD Referring: Verda Cumins, MD External Visit ID:  [id number]    Appointment Facility: Neurosurgery        * * *    02/09/2018  Progress Notes: [name], MD **CHN#:** [id number]    --- ---    ---         **Current Medications**    ---    Taking     * Aspirin 81 MG Tablet Chewable 1 tablet Orally Once a day    ---    * Calcium 600 MG Tablet 1 tablet Orally Twice a day    ---    * Enbrel 50 MG/ML Solution 1 ml Subcutaneous once a week on Friday    ---    * Fish Oil 1200 MG Capsule 1 capsule Orally Once a day    ---    * Gabapentin 300 mg Capsule 1 capsule Oral hs, Notes: takes only when he cannot sleept    ---    * Hydroxychloroquine Sulfate 200 MG Tablet TK 1 T PO Oral Once a day    ---    * PredniSONE 1 MG Tablet 4 tablets Orally Once a day, Notes: alternates 4/5 mg qod    ---    * Prilosec 20 MG Capsule Delayed Release 1 capsule Orally Once a day    ---    * Tramadol HCl 50 MG Tablet (Schedule IV Drug) TK 1 T PO Q 8 H PRN P 4-6 Oral     ---    * Tylenol     ---    * Vitamin D 1000 UNIT Tablet 1 tablet Orally Once a day    ---    * Medication List reviewed and reconciled with the patient    ---      Past Medical History    ---       Rheumatoid arthritis.        ---       **Surgical History**    ---       Left shoulder    ---    Gallbladder    ---      **Social History**    ---    Tobacco history: Never smoked.      **Allergies**    ---       N.K.D.A.    ---    Forrestine Him Verified]       **Hospitalization/Major Diagnostic Procedure**    ---       Denies Past Hospitalization    ---         **Reason for Appointment**    ---       1\. Low back pain and right leg pain    ---       **History of Present Illness**    ---     _NEUROSURGERY_ :    62 year  old male presents in followup with lower back pain and right leg pain  for the past 9 months. The pain is located in the right buttocks (mostly  buttocks), skips the thigh and then goes into the lateral calf into the  plantar surface of the foot to the plantar surface of his right great toe.    The patient reports that he currently doing a HEP for his right foot at this  time.    He then developed low back pain and right leg pain and has been going to  physical therapy for that. He was initially diagnosed with piriformis syndrome  until MRI demonstrated the spondylolisthesis, spinal stenosis, and synovial  cyst compressing the right L5 nerve root. He has history of rheumatoid  arthritis and was started on Enbrel 2-3 months ago. He also takes prednisone.    He has had PT for six months for his low back without relief. He cannot  tolerate NSAIDS due to his being on prednisone for his rheumatoid arthritis.  He underwent lumbar L5-S1 epidural steroid injection on 12/22/17 by Dr.  Reece Leader. The patient reports that after this injection his right leg  symptoms were improved for 2+ weeks.    Today the patient reports that his right leg pain continues to be slightly  improved, however he continues to have lower back pain. His life is still very  limited by his symptoms; he lives in a 2 story house and sleeps in his  recliner 2+ nights/week due severe pain with ambulating stairs. He is here  today to discuss surgery.    The patient had torn tendons on his left ankle; he is currently wearing an  AFO. He wears this when he is out of the house. He was told that he may need a  left ankle fusion.     _Associated Providers_ :    Primary Care Provider Verda Cumins, MD .      **Vital Signs**    ---    Pain scale 3, Ht-in 68.0, Wt-lbs 195, BMI 29.65.       **Physical Examination**    ---     _NEUROSURGERY_ :    MOTOR Lower Extremities : 5/5 strength in the bilateral lower extremities  except:, Right EHL 4/5. Reflexes Left Knee  Jerk 2+, Right Knee Jerk 2+, Left  Ankle Jerk 2+, Right Ankle Jerk 2+. Gait : previously used a cane (stopped  using since his last appointment). Babinski : Negative. Ankle Clonus :  Negative.    _DIAGNOSTIC STUDIES_ :    RADIOLOGY I reviewed lumbar spine MRI completed 09/28/17 at [name].I reviewed lumbar spine xrays that were completed on 11/17/17 at [facility]. Marland Kitchen    AFO LLE.       **Assessments**    ---    1\. Lumbar radiculopathy - M54.16 (Primary)    ---    2\. Spondylolisthesis, lumbar region - M43.16    ---      Reviewing his lumbar spine MRI at L5-S1 there is spondylolisthesis with a  stress fracture. There are degenerative changes at multiple levels in the  lumbar spine (most likely related to RA). The patient has not had lasting  improvement in his symptoms with lumbar PT and injections. He would like to  pursue surgery at this time.    I offere option of a L5-S1 TLIF. This surgery was discussed with the patient  and his wife. Surgical consent signed. His daugther is getting married at the  end of September 2019 and he would like surgery after this date.The patient  will need a pre-op surgical planning lumbar CT prior to surgery.    ---       **Follow Up**    ---  schedule surgery    Electronically signed by [name] , MD on 02/09/2018 at 03:44 PM EDT    Sign off status: Completed        * * *        Neurosurgery    [address] [place], [address]    [place], [place] [address]    Tel: [phone]    Fax: [phone]              * * *          Patient: [name] [name] DOB: 1955-10-02 Progress Note: [name], MD  02/09/2018    ---    Note generated by eClinicalWorks EMR/PM Software ([email or web address])

## 2018-03-10 ENCOUNTER — Ambulatory Visit: Admit: 2018-03-10 | Payer: No Typology Code available for payment source

## 2018-03-10 ENCOUNTER — Ambulatory Visit: Admit: 2018-03-10 | Payer: 59

## 2018-03-10 ENCOUNTER — Ambulatory Visit: Admitting: Neurological Surgery

## 2018-03-10 ENCOUNTER — Ambulatory Visit

## 2018-03-10 ENCOUNTER — Ambulatory Visit: Admitting: Anesthesiology

## 2018-03-10 LAB — HX HEM-ROUTINE
HX HCT: 42.7 % (ref 37.0–47.0)
HX HGB: 14.7 g/dL (ref 13.5–16.0)
HX PLT: 194 10*3/uL (ref 150–400)

## 2018-03-10 NOTE — Progress Notes (Signed)
****    ---    **  Patient:** [name] [name], LARMONAccount Number:** [id number] **External MRN:** [id number]   **Provider:** Sunday Shams, MD     **DOB:** 10/12/1955 **Age:** 54 Y **Sex:** Male   **Date:** 03/10/2018     **Phone:** [phone]   **CHN#:** [id number]     **Address:** [address] [place], [place]     **Pcp:** Verda Cumins, MD        * * *        **Subjective:**        ---       **Chief Complaints:**    --- ---       1\. M54.16 DOS 03/17/18. 2. Please see Clinic Notes in [facility].Marland Kitchen    --- ---      **Medical History:**        --- ---        **Objective:**        ---         **Assessment:**        ---         **Plan:**        ---         --- ---    ---    ---          **Provider:** Sunday Shams, MD    ---     **Patient:** [name] [name] **DOB:** 12/17/1955 **Date:** 03/10/2018    ---    Electronically signed by Bettey Mare on 03/10/2018 at 01:14 PM EDT    Sign off status: Completed

## 2018-03-17 ENCOUNTER — Inpatient Hospital Stay
Admission: RE | Admit: 2018-03-17 | Discharge: 2018-03-21 | Disposition: A | Discharge status: Rehabilitation Facility | Payer: No Typology Code available for payment source

## 2018-03-17 ENCOUNTER — Inpatient Hospital Stay
Admit: 2018-03-17 | Disposition: A | Discharge status: Rehabilitation Facility | Source: Ambulatory Visit | Attending: Neurological Surgery | Admitting: Neurological Surgery

## 2018-03-17 NOTE — Op Note (Signed)
Patient    [name] [name], [name]           Med Rec #:  [id number]  Name:  Operation  03/17/2018                Pt.  Dt:                                  Location:  .  Marland Kitchen                               OPERATIVE REPORT  .  Marland Kitchen  PREOPERATIVE DIAGNOSIS:  L5-S1 isthmic spondylolisthesis with foraminal  stenosis.  Marland Kitchen  POSTOPERATIVE DIAGNOSIS:  L5-S1 isthmic spondylolisthesis with foraminal  stenosis.  Marland Kitchen  PROCEDURES PERFORMED:  1.  L5-S1 laminectomy with complete left L5-S1 facetectomy.  2.  Left L5-S1 transforaminal lumbar interbody fusion using PEEK NuVasive  CoRoent structural device, Formagraft mixed with iliac crest bone marrow  aspirate, left L5-S1 intertransverse arthrodesis/posterolateral fusion  using Formagraft mixed with iliac crest bone marrow aspirate, locally  harvested autograft.  3.  Left L5-S1 and right L5-S1 pedicle screw fixation with fluoroscopic and  EMG guidance.  4.  Left iliac crest bone marrow aspirate.  .  SURGEON:  [name], M.D.  .  ASSISTANT:  Theophilus Kinds, M.D.  .  ANESTHESIA:  General.  .  INDICATIONS:  [name] [name] [name] has multiple pathologies throughout his spine  including scoliosis as well as L5-S1 isthmic spondylolisthesis.  He has  debilitating back pain as well as L5 radicular pain.  The different  treatment options were discussed with him in detail.  He did not respond to  nonoperative treatment and he signed informed consent to proceed with  surgery.  .  DESCRIPTION OF PROCEDURE:  [name] [name] [name] was taken the operating room and  verified.  General endotracheal anesthesia was induced in the usual fashion  and preoperative intravenous antibiotics were administered  prophylactically.  He was carefully logrolled into the prone position on to  gel rolls on the [place] table after placing a Foley catheter.  Great care  was taken to ensure all bony prominences were well padded and the eyes were  free of compression.  Lumbar area prepped and draped in the usual sterile  fashion with a 3-step prep with  the first step being ethyl alcohol and the  second and third steps being ChloraPrep.  This was allowed to dry before  draping the patient and a timeout was performed.  .  Lateral fluoroscopy localized incision over the L5-S1 level on the left  side.  The incisional area was infiltrated with lidocaine and epinephrine  and made 3.5 cm to the left of midline with a #10 Bard-Parker.  Monopolar  dissected down to the fascia and open it.  The Shadow-Line retractor system  was docked subfascially at the L5-S1 level under fluoroscopic guidance.  Electrocautery defined the relevant posterior anatomy.  .  The operating microscope was sterilely draped and brought into the field  for magnification, illumination and microdissection technique.  Midas Rex  drill and Kerrison instruments were used to initiate L5-S1 laminectomies.  This was brought out laterally on the left side and completed a left L5-S1  facetectomy.  The left S1 nerve root was decompressed.  I was unable to see  the left L5 nerve root at this point in time because of the  severity of the  compression.  Therefore, I angled the microscope rostrally and performed a  left L4-L5 hemilaminectomy.  In doing so, I was now able to identify the  shoulder of the left L5 nerve root at the L4-L5 level.  Now this was  decompressed and identified, I angled the microscope caudally and was able  to essentially trace out the left L5 nerve root into the foramen.  I  continued to resect foraminal bone and foraminal ligamentum flavum until  the left L5 nerve root was very well decompressed and very well visualized.  The decompression was completed.  .  I identified the L5-S1 disc space and opened into with a #11 blade lateral  the thecal sac.  Interbody spreaders, scrapers, curettes, pituitary  instruments were used to remove as much disc as possible.  Curettes  meticulously prepared the endplates for interbody arthrodesis and I copious  irrigated.  The Jamshidi was used to aspirate  several mL of bone marrow  aspirate from the left iliac crest site through a small separate stab  incision.  Bone marrow was mixed with Formagraft and placed into NuVasive  PEEK graft 9 mm in height.  The bullet nose 9 mm CoRoent graft was  carefully applied to the disc space using fluoroscopic guidance and tamp  and mallet.  Fluoroscopic imaging showed satisfactory positioning of the  graft.  Locally harvested autograft was placed in the disc space for  additional arthrodesis purposes.  .  I turned my attention to the pedicle screws.  Anatomic landmarks,  gearshift, ball tip probe, fluoroscopic imaging, EMG guidance were used to  guide NuVasive Armada screws into left L5 and S1 pedicles.  A rod was  placed and set screws were used and the torque counter torque device used  for final tightening.  Copious antibiotic irrigation was used.  Osteomatrix  mixed with iliac crest bone marrow aspirate along with locally harvested  autograft was placed along the decorticated left L5 and S1 transverse  processes to complete the posterolateral arthrodesis.  Surgifoam and  bipolar were used for hemostasis.  The retractor was removed and a standard  3-layer closure was performed with antibiotic irrigation after each layer.  Steri-Strips were placed for final closure.  .  A similar incision was made 3.5 cm to the right of midline.  The  Shadow-Line retractor was docked at the L5-S1 facet on the right side and  the Bovie electrocautery was used to provide the exposure. Anatomic  landmarks, gearshift, ball tip probe, fluoroscopic imaging, EMG guidance  were now used to guide NuVasive Armada screws into the right L5 and S1  pedicles.  A rod was placed and set screws were used and the torque counter  device used for final tightening.  Copious antibiotic irrigation was used  and Surgifoam and bipolar electrocautery were used for hemostasis.  Copious  amounts of antibiotic were used and the retractor was removed and a  standard 3-layer  closure was performed with antibiotic irrigation after  each layer.  Steri-Strips were placed for final closure.  He was carefully  logrolled supine, extubated and moved his lower extremities with his  preoperative strength.  All instruments and sponge counts were correct, and  there were no intraoperative complications.  .  Marland Kitchen  Electronically Signed  [name], MD 03/30/2018 03:03 P  .  Marland Kitchen  Dictated byCorinna Capra, MD  .  D:    03/17/2018  T:  03/17/2018 02:39 P  Dictation ID:  [id number]/Doc#  [id number]  .  cc:  .  Marland Kitchen      Document is preliminary until electronically or manually signed by                             attending physician.

## 2018-03-18 LAB — HX HEM-ROUTINE
HX HCT: 38.1 % (ref 37.0–47.0)
HX HGB: 13.1 g/dL — ABNORMAL LOW (ref 13.5–16.0)
HX MCH: 30.5 pg (ref 26.0–34.0)
HX MCHC: 34.4 g/dL (ref 32.0–36.0)
HX MCV: 88.6 fL (ref 80.0–98.0)
HX MPV: 9.4 fL (ref 9.1–11.7)
HX NRBC #: 0 10*3/uL
HX NUCLEATED RBC: 0 %
HX PLT: 138 10*3/uL — ABNORMAL LOW (ref 150–400)
HX RBC BLOOD COUNT: 4.3 M/uL (ref 4.20–5.50)
HX RDW: 14.3 % (ref 11.5–14.5)
HX WBC: 10.8 10*3/uL (ref 4.0–11.0)

## 2018-03-18 LAB — HX CHEM-PANELS
HX ANION GAP: 3 (ref 3–14)
HX BLOOD UREA NITROGEN: 8 mg/dL (ref 6–24)
HX CHLORIDE (CL): 99 meq/L (ref 98–110)
HX CO2: 32 meq/L — ABNORMAL HIGH (ref 20–30)
HX CREATININE (CR): 0.78 mg/dL (ref 0.57–1.30)
HX GFR, AFRICAN AMERICAN: 110 mL/min/{1.73_m2}
HX GFR, NON-AFRICAN AMERICAN: 95 mL/min/{1.73_m2}
HX GLUCOSE: 112 mg/dL (ref 70–139)
HX POTASSIUM (K): 4 meq/L (ref 3.6–5.1)
HX SODIUM (NA): 134 meq/L — ABNORMAL LOW (ref 135–145)

## 2018-03-18 LAB — HX CHEM-LFT
HX ALANINE AMINOTRANSFERASE (ALT/SGPT): 24 IU/L (ref 0–54)
HX ALKALINE PHOSPHATASE (ALK): 46 IU/L (ref 40–130)
HX ASPARTATE AMINOTRANFERASE (AST/SGOT): 33 IU/L (ref 10–42)
HX BILIRUBIN, DIRECT: 0.9 mg/dL — ABNORMAL HIGH (ref 0.0–0.5)
HX BILIRUBIN, TOTAL: 3.1 mg/dL — ABNORMAL HIGH (ref 0.2–1.1)
HX LACTATE DEHYDROGENASE (LDH): 162 IU/L (ref 120–220)

## 2018-03-18 LAB — HX CHEM-OTHER
HX CALCIUM (CA): 9 mg/dL (ref 8.5–10.5)
HX MAGNESIUM: 1.6 mg/dL (ref 1.6–2.6)
HX PHOSPHORUS: 2.7 mg/dL (ref 2.7–4.5)

## 2018-03-18 LAB — HX DIABETES: HX GLUCOSE: 112 mg/dL (ref 70–139)

## 2018-05-04 ENCOUNTER — Ambulatory Visit: Admit: 2018-05-04 | Payer: No Typology Code available for payment source

## 2018-05-04 ENCOUNTER — Ambulatory Visit: Admitting: Neurological Surgery

## 2018-05-04 ENCOUNTER — Ambulatory Visit

## 2018-05-04 NOTE — Progress Notes (Signed)
* * *        [name] [name]    --- ---    [name], DOB: Oct 09, 1955, External MRN: [id number]    Account Number: [id number]    [address] [place], [place]    Home: [phone]    Insurance: NHP OUT IPA Payer ID: PAPER    PCP: Verda Cumins, MD Referring: Verda Cumins, MD External Visit ID:  [id number]    Appointment Facility: Neurosurgery        * * *    05/04/2018  Progress Notes: [name], MD **CHN#:** [id number]    --- ---    ---         **Current Medications**    ---    Taking     * Aspirin 81 MG Tablet Chewable 1 tablet Orally Once a day    ---    * Calcium 600 MG Tablet 1 tablet Orally Twice a day    ---    * Enbrel 50 MG/ML Solution 1 ml Subcutaneous once a week on Friday    ---    * Gabapentin 300 mg Capsule 1 capsule Oral hs    ---    * Hydroxychloroquine Sulfate 200 MG Tablet TK 1 T PO Oral Once a day    ---    * PredniSONE 1 MG Tablet 4 tablets Orally Once a day, Notes: alternates 4/5 mg qod    ---    * Prilosec 20 MG Capsule Delayed Release 1 capsule Orally Once a day    ---    * Tylenol     ---    * Vitamin D 1000 UNIT Tablet 1 tablet Orally Once a day    ---    * Medication List reviewed and reconciled with the patient    ---      Past Medical History    ---       Rheumatoid arthritis.        ---       **Surgical History**    ---       Left shoulder    ---    Gallbladder    ---    L5-S1 TLIF (transforaminal lumbar interbody fusion) with bilateral screws  03/17/2018    ---       **Social History**    ---    Tobacco history: Never smoked.      **Allergies**    ---       N.K.D.A.    ---    Forrestine Him Verified]       **Hospitalization/Major Diagnostic Procedure**    ---       see surgeries    ---        **Reason for Appointment**    ---       1\. postoperative followup status post L5-S1 TLIF (transforaminal lumbar  interbody fusion) 03/17/18    ---       **History of Present Illness**    ---     _Associated Providers_ :    Primary Care Provider Verda Cumins, MD .     _NEUROSURGERY_ :    62 y.o. male presents in postoperative followup status post L5-S1 TLIF  (transforaminal lumbar interbody fusion) with bilateral screw fixation on  03/17/18. He is doing well postoperatively. 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 His preoperative right leg pain has  resolved. He resumed his Enbrel two weeks postoperatively. His  home PT ended  last Monday.       **Vital Signs**    ---    Pain scale 1, Ht-in 68.0, Wt-lbs 185, BMI 28.13, BSA 2.00, Ht-cm 172.72, Wt-kg  83.92, Wt Change -10 lb.       **Physical Examination**    ---     _NEUROSURGERY_ :    MOTOR Lower Extremities : 5/5 strength in the bilateral lower extremities.  Reflexes Left Knee Jerk 2+, Right Knee Jerk 2+, Left Ankle Jerk 2+, Right  Ankle Jerk 2+. Gait : WNL. Babinski : Negative. Ankle Clonus : Negative.    _DIAGNOSTIC STUDIES_ :    RADIOLOGY I reviewed Xray lumbar spine ap/lateral performed on 05/04/18 at  [facility].    bilateral lumbar incisions well healed without erythema.       **Assessments**    ---    1\. Spondylolisthesis, lumbar region - M43.16 (Primary)    ---      doing great, xrays look great, he will ramp up activities and f/u prn.    ---       **Follow Up**    ---    prn    Electronically signed by [name] , MD on 05/04/2018 at 02:27 PM EST    Sign off status: Completed        * * *        Neurosurgery    [address] [place], [address]    [place], [place] [address]    Tel: [phone]    Fax: [phone]              * * *          Patient: [name] [name], [name] [name] DOB: [birth year] Progress Note: [name], MD  05/04/2018    ---    Note generated by eClinicalWorks EMR/PM Software ([email or web address])

## 2018-05-04 NOTE — Progress Notes (Signed)
.    Progress Notes  .  Patient: [name] [name]  Provider: [name] I   .  DOB: 1955/12/28 Age: 62 Y Sex: Male  .  PCP: Verda Cumins MD  Date: 05/04/2018  .  --------------------------------------------------------------------------------  .  REASON FOR APPOINTMENT  .  1. postoperative followup status post L5-S1 TLIF (transforaminal  lumbar interbody fusion) 03/17/18  .  HISTORY OF PRESENT ILLNESS  .  Associated Providers:  Primary Care Provider  Verda Cumins, MD .  .  NEUROSURGERY:   62 y.o. male presents in postoperative followup status post  L5-S1 TLIF (transforaminal lumbar interbody fusion) with  bilateral screw fixation on 03/17/18. He is doing well  postoperatively. 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 His preoperative right leg pain has  resolved. He resumed his Enbrel two weeks postoperatively. His  home PT ended last Monday.  .  CURRENT MEDICATIONS  .  Taking Aspirin 81 MG Tablet Chewable 1 tablet Orally Once a day  Taking Calcium 600 MG Tablet 1 tablet Orally Twice a day  Taking Enbrel 50 MG/ML Solution 1 ml Subcutaneous once a week on  Friday  Taking Gabapentin 300 mg Capsule 1 capsule Oral hs  Taking Hydroxychloroquine Sulfate 200 MG Tablet TK 1 T PO Oral  Once a day  Taking PredniSONE 1 MG Tablet 4 tablets Orally Once a day, Notes:  alternates 4/5 mg qod  Taking Prilosec 20 MG Capsule Delayed Release 1 capsule Orally  Once a day  Taking Tylenol  Taking Vitamin D 1000 UNIT Tablet 1 tablet Orally Once a day  Medication List reviewed and reconciled with the patient  .  PAST MEDICAL HISTORY  .  Rheumatoid arthritis  .  ALLERGIES  .  N.K.D.A.  .  SURGICAL HISTORY  .  Left shoulder  Gallbladder  L5-S1 TLIF (transforaminal lumbar interbody fusion) with  bilateral screws 03/17/2018  .  SOCIAL HISTORY  .  .  Tobaccohistory:Never smoked.  Marland Kitchen  HOSPITALIZATION/MAJOR DIAGNOSTIC PROCEDURE  .  see surgeries  .  VITAL SIGNS  .  Pain scale 1, Ht-in 68.0, Wt-lbs 185, BMI  28.13, BSA 2.00, Ht-cm  172.72, Wt-kg 83.92, Wt Change -10 lb.  .  PHYSICAL EXAMINATION  .  NEUROSURGERY:  MOTOR Lower Extremities  : 5/5 strength in the bilateral lower  extremities. :5/5 strength in the bilateral lower extremities.  Reflexes  Left Knee Jerk 2+, Right Knee Jerk 2+, Left Ankle Jerk  2+, Right Ankle Jerk 2+. Left Knee Jerk2+ , Right Knee Jerk2+ ,  Left Ankle Jerk2+ , Right Ankle Jerk2+. Gait  : WNL. :WNL.  Babinski  : Negative. :Negative. Ankle Clonus  : Negative.  :Negative.  DIAGNOSTIC STUDIES:  RADIOLOGY  I reviewed Xray lumbar spine ap/lateral performed on  05/04/18 at [facility].  bilateral lumbar incisions well healed without erythema.  .  ASSESSMENTS  .  Spondylolisthesis, lumbar region - M43.16 (Primary)  .  doing great, xrays look great, he will ramp up activities and f/u  prn.  .  FOLLOW UP  .  prn  .  Electronically signed by [name] , MD on  05/04/2018 at 02:27 PM EST  .  Document electronically signed by Dionisio David, RON I   .

## 2018-06-30 ENCOUNTER — Ambulatory Visit: Admit: 2018-06-30 | Payer: HMO

## 2018-06-30 ENCOUNTER — Ambulatory Visit: Admitting: Hand Surgery

## 2018-06-30 ENCOUNTER — Ambulatory Visit

## 2018-07-03 ENCOUNTER — Ambulatory Visit: Admitting: Primary Care

## 2018-07-03 NOTE — Progress Notes (Signed)
* * *      **  [name] [name]**    ------    31 Y old Male, DOB: 06-27-1955    [address] [place], [place] [address]    Home: [phone]    Provider: Verdon Cummins        * * *    Telephone Encounter    ---    Answered by  Verdon Cummins Date: 07/03/2018       Time: 04:26 PM    Message                     Patient called to report that he has had a return of his lower back pain. He was at [facility] on Friday for a shoulder appointment. He walked back to his car in the [place] garage and walked up a ramp. He felt a small amount of lower  back pain at that time. He proceeded to drive home and the pain worsened to his lower back. He has been using ice over the weekend and has decreased his activity (he usually walks and goes to the gym to use to use the stationary bike). The symptoms have improved slightly at this time.             The patient is encouraged to monitor the symptoms and if they worsen/new symptoms start (radiation of pain down the legs or leg weakness) he is encouraged to contact this office to follow-up.             He used ice over the weekend. The pain is now present to his lower back.         ------                * * *                ---          * * *         Patient: [name] [name] DOB: 11-Mar-1956 Provider: Verdon Cummins  07/03/2018    ---    Note generated by eClinicalWorks EMR/PM Software ([email or web address])

## 2018-08-01 ENCOUNTER — Ambulatory Visit: Admit: 2018-08-01 | Payer: HMO

## 2018-08-01 ENCOUNTER — Ambulatory Visit

## 2018-08-01 ENCOUNTER — Ambulatory Visit: Admitting: Hand Surgery

## 2018-11-15 ENCOUNTER — Ambulatory Visit: Admit: 2018-11-15 | Payer: HMO

## 2018-11-15 ENCOUNTER — Ambulatory Visit

## 2018-11-15 NOTE — Progress Notes (Signed)
 [name] [name], [name] [name] **DOB:** 04/11/1956 (63 yo M) **Acc No.** [id number] **DOS:**  11/15/2018    ---      ****    ---    **Patient:** [name] [name], [name] [name]     **Account Number:** [id number] **External MRN:** [id number]  **Provider:**  Appointment Resource     **DOB:** 24-Jan-1956 **Age:** 63 Y **Sex:** Male  **Date:** 11/15/2018     **Phone:** [phone]     **Address:** [address], [place], [place]     **Pcp:** Verda Cumins, MD        * * *        **Subjective:**        ---      **Chief Complaints:**    ------      1\. Z00.923 DOS 11/21/18. 2. Please see Clinic Notes in [facility].Marland Kitchen    ------     **Medical History:**        ------        **Objective:**        ---         **Assessment:**        ---         **Plan:**        ---        ------    ---    ---                ---    Electronically signed by Bettey Mare on 11/15/2018 at 10:47 AM EDT    Sign off status: Completed          * * *      **Provider:** Appointment Resource  **Date:** 11/15/2018    ------

## 2018-11-17 ENCOUNTER — Ambulatory Visit: Admit: 2018-11-17 | Payer: HMO

## 2018-11-17 ENCOUNTER — Ambulatory Visit: Admitting: Surgery

## 2018-11-17 LAB — HX CHEM-PANELS
HX ANION GAP: 7 (ref 3–14)
HX BLOOD UREA NITROGEN: 14 mg/dL (ref 6–24)
HX CHLORIDE (CL): 102 meq/L (ref 98–110)
HX CO2: 30 meq/L (ref 20–30)
HX CREATININE (CR): 0.85 mg/dL (ref 0.57–1.30)
HX GFR, AFRICAN AMERICAN: 106 mL/min/{1.73_m2}
HX GFR, NON-AFRICAN AMERICAN: 92 mL/min/{1.73_m2}
HX GLUCOSE: 100 mg/dL (ref 70–139)
HX POTASSIUM (K): 3.9 meq/L (ref 3.6–5.1)
HX SODIUM (NA): 139 meq/L (ref 135–145)

## 2018-11-17 LAB — HX TRANSFUSION
HX ABO-RH INTERPRETATION (GEL): O POS
HX ANTIBODY SCREEN (GEL): NEGATIVE

## 2018-11-17 LAB — HX CHEM-OTHER: HX CALCIUM (CA): 9.4 mg/dL (ref 8.5–10.5)

## 2018-11-17 LAB — HX HEM-ROUTINE
HX HCT: 43.8 % (ref 37.0–47.0)
HX HGB: 15 g/dL (ref 13.5–16.0)
HX PLT: 215 10*3/uL (ref 150–400)

## 2018-11-17 LAB — HX DIABETES: HX GLUCOSE: 100 mg/dL (ref 70–139)

## 2018-11-18 LAB — HX MICRO-RESP VIRAL PANEL: HX COVID-19 (SARS-COV-2): NEGATIVE

## 2018-11-21 ENCOUNTER — Inpatient Hospital Stay
Admit: 2018-11-21 | Disposition: A | Discharge status: Home / Self Care | Source: Ambulatory Visit | Attending: Hand Surgery | Admitting: Hand Surgery

## 2018-11-21 NOTE — Op Note (Signed)
 Patient    [name] [name], [name] [name]           Med Rec #:  [id number]  Name:  Operation  11/21/2018                Pt.  Dt:                                  Location:  .  Marland Kitchen                               OPERATIVE REPORT  .  Marland Kitchen  PREOPERATIVE DIAGNOSIS:  Left shoulder arthritis.  Marland Kitchen  POSTOPERATIVE DIAGNOSIS:  Left shoulder arthritis.  Marland Kitchen  PROCEDURE:  Left reverse total shoulder arthroplasty.  .  SURGEON:  Yetta Numbers, MD  .  ASSISTANT:  Milinda Cave, MD  .  ANESTHESIA:  General.  .  COMPLICATION:  None.  .  ESTIMATED BLOOD LOSS:  50 mL.  .  IMPLANT:  Tornier Aequalis reversed size 7 stem, high offset tray in the  6:30 position, 6 mm insert, 25x25 mm base plate, 36 mm lateralized offset.  .  INDICATIONS:  He has advanced arthritis of the left shoulder, refractory to  conservative measures.  He is status post a shoulder stabilization  procedure in the remote past.  That scar is quite medial and consequently  we used a more lateral incision.  Risks of the procedure including, but not  limited to infection, nerve damage, stiffness, implant loosening,  instability, and incomplete symptom relief were explained to the patient  preoperatively who wished to proceed.  .  DESCRIPTION OF THE PROCEDURE:  Following adequate anesthesia, the patient  was placed in the supine position on the operating table.  A bump was  placed beneath the left scapula.  The left shoulder and upper extremity  were prepped and draped in sterile fashion.  A left deltopectoral incision  was made.  The cephalic vein was very small, presumably from the prior  procedure.  The deltoid adhesions were mobilized.  The reflected head of  the pectoralis tendon was released and the bicep tendon was tenodesed to  the pectoralis insertion using #2 FiberWire suture.  The biceps tendon was  then traced proximally into the bicipital groove.  The clavipectoral fascia  was incised and the subscapularis tendon was released from the lesser  tuberosity and tagged.  Adhesions in  the subscapularis were released with  care to protect the axillary nerve.  The capsule was released at the level  of the neck of the humerus and inferior osteophytes were trimmed.  The  shoulder was dislocated anteriorly.  A highly tendinopathic supraspinatus  was released, leaving the external rotators in place.  The intramedullary  alignment guide was then used to resect the humeral head in approximately  20 degrees of retroversion.  The humerus was then retracted posteriorly.  The glenoid retractors were placed.  The residual labrum and bicep tendon  were excised.  There was loss of the anterior lip of the glenoid,  presumably from his history of instability.  The guide was then used to  center the threaded guide pin at the inferior margin of the glenoid.  This  measured 25 mm, which correlated with the preoperative templating.  The  glenoid was then reamed to a flat surface.  The peripheral reamer was then  used.  The  guidewire was then overdrilled and a hole was tapped and a 25 x  25 mm threaded baseplate was inserted with excellent purchase.  Two  additional locking screws were placed in a standard fashion.  Given that  the glenoid was somewhat medialized, we used the lateralized glenosphere.  This was impacted and the screw was tightened to ensure that it was  secure.  .  Next, the humeral canal was prepared using sounds and broaches and a size 7  implant was most appropriate.  This was placed in 20 degrees of  retroversion and a trial reduction was performed.  The high offset tray in  the 6:30 position with a 6 mm insert was most appropriate.  There was no  impingement throughout the full arc of motion.  The trial implant was then  removed.  Three drill holes were placed in the anterior neck of the humerus  for repair of the subscapularis tendon using #2 FiberWire sutures.  The  sutures were preplaced.  The actual tray was impacted onto the actual stem  in the 6:30 position and then the implant was impacted  in 20 degrees of  retroversion.  The 6 mm polyethylene was then snapped into place and the  shoulder was reduced with stability noted as with the trial.  The  subscapularis was somewhat tight and consequently, a fractional lengthening  of the tendon was performed with care to protect the axillary nerve.  At  tendon was then secured to the lesser tuberosity using the preplaced  FiberWire sutures in approximately 30 degrees of external rotation.  The  wound was irrigated copiously.  Hemostasis was achieved with bipolar  electrocautery.  The deltopectoral interval was approximated with 0 Vicryl  running suture.  The subcutaneous tissue was approximated with 3-0 Vicryl  interrupted sutures and the skin with 4-0 Monocryl running subcuticular  sutures.  Steri-Strips and an Aquacel dressing and sling were placed.  The  patient tolerated the procedure well and was discharged to the recovery  room in good condition.  .  Electronically Signed  Yetta Numbers, MD 11/28/2018 08:49 P  .  .  Marland Kitchen  Dictated byYetta Numbers, MD  .  D:    11/26/2018  T:    11/26/2018 08:52 P  Dictation ID:  [id number]/Doc#  [id number]  .  cc:  .  Marland Kitchen      Document is preliminary until electronically or manually signed by                             attending physician.

## 2018-11-22 MED FILL — *ASPIRIN EC 325 MG TBEC: 30 days supply | Qty: 30 | Fill #0 | Status: AC

## 2018-11-22 MED FILL — oxyCODONE HCL 5 MG TABS: 2 days supply | Qty: 20 | Fill #0 | Status: AC

## 2018-11-22 MED FILL — *ACETAMINOPHEN 500mg Tablet: 15 days supply | Qty: 90 | Fill #0 | Status: AC

## 2018-12-04 ENCOUNTER — Ambulatory Visit: Admit: 2018-12-04 | Payer: HMO

## 2018-12-04 ENCOUNTER — Ambulatory Visit: Admitting: Hand Surgery

## 2018-12-04 ENCOUNTER — Ambulatory Visit: Admitting: Surgical

## 2018-12-04 NOTE — Progress Notes (Signed)
 .  Progress Notes  .  Patient: [name] [name]  Provider: Herbert Deaner    .  DOB: 08-06-1955 Age: 63 Y Sex: Male  Supervising Provider:: Yetta Numbers, MD  Date: 12/04/2018  .  PCP: Verda Cumins MD  Date: 12/04/2018  .  --------------------------------------------------------------------------------  .  REASON FOR APPOINTMENT  .  1. Status post left reverse total shoulder arthroplasty,  11/21/18.  Marland Kitchen  HISTORY OF PRESENT ILLNESS  .  GENERAL:   Follow-up. The patient is a 63 year old left-hand dominant male  with rheumatoid arthritis who is now nearly 2 weeks status post  the above procedure. He is doing quite well. He notes significant  pain relief. He has started physical therapy. He has been  compliant with his sling.  .  CURRENT MEDICATIONS  .  Taking Aspirin 81 MG Tablet Chewable 1 tablet Orally Once a day  Taking Calcium 600 MG Tablet 1 tablet Orally Twice a day  Taking Enbrel 50 MG/ML Solution 1 ml Subcutaneous once a week on  Friday  Taking Hydroxychloroquine Sulfate 200 MG Tablet TK 1 T PO Oral  Once a day  Taking PredniSONE 1 MG Tablet 4 tablets Orally Once a day, Notes:  alternates 4/5 mg qod  Taking Prilosec 20 MG Capsule Delayed Release 1 capsule Orally  Once a day  Taking Tylenol  Taking Vitamin D 1000 UNIT Tablet 1 tablet Orally Once a day  Not-Taking/PRN Gabapentin 300 mg Capsule 1 capsule Oral hs  Medication List reviewed and reconciled with the patient  .  PAST MEDICAL HISTORY  .  Rheumatoid arthritis  .  ALLERGIES  .  N.K.D.A.  .  SURGICAL HISTORY  .  Left shoulder  Gallbladder  L5-S1 TLIF (transforaminal lumbar interbody fusion) with  bilateral screws 03/17/2018  Left Reverse total Shoulder Arthroplasty. 11/21/2018  .  FAMILY HISTORY  .  Arthritis.  .  SOCIAL HISTORY  .  .  Tobaccohistory:Never smoked.  .  Marital Status Married.  .  Alcohol Denies.  Marland Kitchen  HOSPITALIZATION/MAJOR DIAGNOSTIC PROCEDURE  .  see surgeries  .  REVIEW OF SYSTEMS  .  ORT:  .  Eyes    No . Ear, Nose Throat    No .  Digestion, Stomach, Bowel     No . Bladder Problems    No . Bleeding Problems    No .  Numbness/Tingling    No . Anxiety/Depression    No .  Fever/Chills/Fatigue    No . Chest Pain/Tightness/Palpitations     No . Skin Rash    No . Dental Problems    No . Joint/Muscle  Pain/Cramps    Yes . Blackout/Fainting    No . Other    No .  .  VITAL SIGNS  .  Pain scale 0, Ht-in 68.0, Ht-cm 172.72.  Marland Kitchen  EXAMINATION  .  GENERAL: On examination today, he is a  well-appearing pleasant male in no apparent distress. He is  accompanied today by his wife. On examination of his left  shoulder, surgical incision is healing well without erythema,  drainage, or evidence of infection. Sensation is intact to light  touch in his left upper extremity. He fires his deltoid. He has  well maintained elbow and digital range of motion. He has passive  forward flexion today to approximately 90 degrees. He does have  some active abduction.RADIOGRAPHS: Radiographs obtained today and  reviewed by US demonstrate a left reverse total shoulder  arthroplasty in good position.  Marland Kitchen  ASSESSMENTS  .  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  TREATMENT  .  Others  Notes: The patient was seen today with Dr. Rodman Pickle. He was  provided with another copy of the postoperative protocol. He may  work on active assisted and passive range of motion. He should  avoid weightbearing and internal rotation behind his back. We  would like to see him back in 6 weeks for reevaluation. He may  restart his Enbrel on Friday, 12/15/18.  .  .  FOLLOW UP  .  6 Weeks (Reason: No Xrays needed)  .  Marland Kitchen  Appointment Provider: Herbert Deaner, [facility]  .  Electronically signed by Yetta Numbers , MD on  02/11/2019 at 08:33 PM EDT  .  CONFIRMATORY SIGN OFF  .  Marland Kitchen  Document electronically signed by Herbert Deaner    .

## 2018-12-04 NOTE — Progress Notes (Signed)
 [name] [name] **DOB:** 1955/11/04 (63 yo M) **Acc No.** [id number] **DOS:**  12/04/2018    ---       [name] [name]**    ------    [name], DOB: [birth year], External MRN: [id number]    Account Number: [id number]    [address], [place], [place]    Home: [phone]    Insurance: [employer] OUT [employer]    PCP: Verda Cumins, MD Referring: Verda Cumins, MD    App[facility] Facility[facility]        * * *    12/04/2018  **Appointment Provider:** Herbert Deaner, [facility] **CHN#:** [phone]    ------     **Supervising Provider:** Yetta Numbers, MD    ---       **Reason for Appointment**    ---      1\. Status post left reverse total shoulder arthroplasty, 11/21/18.    ---      **History of Present Illness**    ---     _GENERAL_ :    Follow-up. The patient is a 63 year old left-hand dominant male with  rheumatoid arthritis who is now nearly 2 weeks status post the above  procedure. He is doing quite well. He notes significant pain relief. He has  started physical therapy. He has been compliant with his sling.      **Current Medications**    ---    Taking    * Aspirin 81 MG Tablet Chewable 1 tablet Orally Once a day    ---    * Calcium 600 MG Tablet 1 tablet Orally Twice a day    ---    * Enbrel 50 MG/ML Solution 1 ml Subcutaneous once a week on Friday    ---    * Hydroxychloroquine Sulfate 200 MG Tablet TK 1 T PO Oral Once a day    ---    * PredniSONE 1 MG Tablet 4 tablets Orally Once a day, Notes: alternates 4/5 mg qod    ---    * Prilosec 20 MG Capsule Delayed Release 1 capsule Orally Once a day    ---    * Tylenol     ---    * Vitamin D 1000 UNIT Tablet 1 tablet Orally Once a day    ---    Not-Taking/PRN    * Gabapentin 300 mg Capsule 1 capsule Oral hs    ---    * Medication List reviewed and reconciled with the patient    ---      **Past Medical History**    ---      Rheumatoid arthritis.        ---      **Surgical History**    ---      Left shoulder    ---     Gallbladder    ---    L5-S1 TLIF (transforaminal lumbar interbody fusion) with bilateral screws  03/17/2018    ---    Left Reverse total Shoulder Arthroplasty. 11/21/2018    ---      **Family History**    ---      Arthritis.    ---      **Social History**    ---    Tobacco  history: Never smoked.    Marital Status  Married.    Alcohol  Denies.     **Allergies**    ---      N.K.D.A.    ---      **  Hospitalization/Major Diagnostic Procedure**    ---      see surgeries    ---     **Review of Systems**    ---     _ORT_ :    Eyes No. Ear, Nose Throat No. Digestion, Stomach, Bowel No. Bladder Problems  No. Bleeding Problems No. Numbness/Tingling No. Anxiety/Depression No.  Fever/Chills/Fatigue No. Chest Pain/Tightness/Palpitations No. Skin Rash No.  Dental Problems No. Joint/Muscle Pain/Cramps Yes. Blackout/Fainting No. Other  No.         **Vital Signs**    ---    Pain scale 0, Ht-in 68.0, Ht-cm 172.72.      **Examination**    ---     _GENERAL:_    On examination today, he is a well-appearing pleasant male in no apparent  distress. He is accompanied today by his wife. On examination of his left  shoulder, surgical incision is healing well without erythema, drainage, or  evidence of infection. Sensation is intact to light touch in his left upper  extremity. He fires his deltoid. He has well maintained elbow and digital  range of motion. He has passive forward flexion today to approximately 90  degrees. He does have some active abduction.    RADIOGRAPHS: Radiographs obtained today and reviewed by US demonstrate a left  reverse total shoulder arthroplasty in good position.          **Assessments**    ---    1\. 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 Others**    Notes: The patient was seen today with Dr. Rodman Pickle. He was provided with  another copy of the postoperative protocol. He may work on active assisted and  passive range of motion. He should avoid  weightbearing and internal rotation  behind his back. We would like to see him back in 6 weeks for reevaluation. He  may restart his Enbrel on Friday, 12/15/18.    Marland Kitchen    ---     **Follow Up**    ---    6 Weeks (Reason: No Xrays needed)    **Appointment Provider:** Herbert Deaner, [facility]    Electronically signed by Yetta Numbers , MD on 02/11/2019 at 08:33 PM EDT    Sign off status: Completed        * * *        [facility]    [address]    [place], [address]    [place], [place] [address]    Tel: [phone]    Fax: [phone]              * * *          Progress Note: Herbert Deaner, [facility] 12/04/2018    ---    Note generated by eClinicalWorks EMR/PM Software ([email or web address])

## 2019-01-15 ENCOUNTER — Ambulatory Visit: Admit: 2019-01-15 | Payer: HMO

## 2019-01-15 ENCOUNTER — Ambulatory Visit

## 2019-01-15 ENCOUNTER — Ambulatory Visit: Admitting: Hand Surgery

## 2019-01-15 NOTE — Progress Notes (Signed)
 [name] [name] **DOB:** 1955-09-24 (63 yo M) **Acc No.** [id number] **DOS:**  01/15/2019    ---       [name] [name]**    ------    39 Y old Male, DOB: 1955-10-07, External MRN: [id number]    Account Number: [id number]    [address], [place], [place]    Home: [phone]    Insurance: [employer] OUT [employer]    PCP: Verda Cumins, MD Referring: Verda Cumins, MD    Appointment Facility: [facility]        * * *    01/15/2019  **Appointment Provider:** Randel Pigg    ------     **Supervising Provider:** Yetta Numbers, MD    ---       **Reason for Appointment**    ---      1\. s/p left reverse TSA 11/21/18    ---      **History of Present Illness**    ---     _GENERAL_ :    63 y/o male 6 weeks s/p the above procedure. He reports he is doing well. No  pain since surgery, not taking any pain meds. He has been doing physical  therapy and is reportedly doing very well. He had a small suture erupt from  his incision two days ago with some drainage, however it did not continue to  drain and he reports no other wound issues. Otherwise no complaints.      **Current Medications**    ---    Taking    * Aspirin 81 MG Tablet Chewable 1 tablet Orally Once a day    ---    * Calcium 600 MG Tablet 1 tablet Orally Twice a day    ---    * Enbrel 50 MG/ML Solution 1 ml Subcutaneous once a week on Friday    ---    * Hydroxychloroquine Sulfate 200 MG Tablet TK 1 T PO Oral Once a day    ---    * PredniSONE 1 MG Tablet 4 tablets Orally Once a day, Notes: alternates 4/5 mg qod    ---    * Prilosec 20 MG Capsule Delayed Release 1 capsule Orally Once a day    ---    * Vitamin D 1000 UNIT Tablet 1 tablet Orally Once a day    ---    Not-Taking/PRN    * Gabapentin 300 mg Capsule 1 capsule Oral hs    ---    * Tylenol     ---    * Medication List reviewed and reconciled with the patient    ---      **Past Medical History**    ---      Rheumatoid arthritis.        ---      **Surgical  History**    ---      Left shoulder    ---    Gallbladder    ---    L5-S1 TLIF (transforaminal lumbar interbody fusion) with bilateral screws  03/17/2018    ---    Left Reverse total Shoulder Arthroplasty. 11/21/2018    ---      **Family History**    ---      Arthritis.    ---      **Social History**    ---    Tobacco  history: Never smoked.    Marital Status  Married.    Alcohol  Denies.     **  Allergies**    ---      N.K.D.A.    ---      **Hospitalization/Major Diagnostic Procedure**    ---      see surgeries    ---     **Review of Systems**    ---     _ORT_ :    Eyes No. Ear, Nose Throat No. Digestion, Stomach, Bowel No. Bladder Problems  No. Bleeding Problems No. Numbness/Tingling No. Anxiety/Depression No.  Fever/Chills/Fatigue No. Chest Pain/Tightness/Palpitations No. Skin Rash No.  Dental Problems No. Joint/Muscle Pain/Cramps Yes. Blackout/Fainting No. Other  No.         **Vital Signs**    ---    Pain scale 0, Ht-in 68.0, Ht-cm 172.72.      **Physical Examination**    ---    NAD, AOx3    Shoulder incision healing well, no erythema, small 1mm ulceration at proximal  most extent of wound consistent with suture abscess, otherwise no drainage,  able to forward elevate to 110, ER 15, deferred IR, NVI.      **Assessments**    ---    1\. 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    ---      **Treatment**    ---      **1\. Arthritis of left shoulder region**    Notes: Sutures were removed, steri-strips applied, discussed wound care,  discussed dental prophylaxis prior to exams for at least the next year,  continue PT, follow up at 6 months post-op.    ---     **Follow Up**    ---    4 Months with xrays left shoulder    **Appointment Provider:** CHRISTOPHER HONSTAD    Electronically signed by Yetta Numbers , MD on 01/26/2019 at 07:59 AM EDT    Sign off  status: Completed        * * *        [facility]    [address]    [place], [address]    [place], [place] [address]    Tel: [phone]    Fax: [phone]              * * *          Progress Note: Randel Pigg 01/15/2019    ---    Note generated by eClinicalWorks EMR/PM Software ([email or web address])

## 2019-01-15 NOTE — Progress Notes (Signed)
 .  Progress Notes  .  Patient: [name] [name]  Provider: Randel Pigg    .  DOB: 1955/12/13 Age: 63 Y Sex: Male  Supervising Provider:: Yetta Numbers, MD  Date: 01/15/2019  .  PCP: Verda Cumins MD  Date: 01/15/2019  .  --------------------------------------------------------------------------------  .  REASON FOR APPOINTMENT  .  1. s/p left reverse TSA 11/21/18  .  HISTORY OF PRESENT ILLNESS  .  GENERAL:  63 y/o male 6 weeks s/p the above  procedure. He reports he is doing well. No pain since surgery,  not taking any pain meds. He has been doing physical therapy and  is reportedly doing very well. He had a small suture erupt from  his incision two days ago with some drainage, however it did not  continue to drain and he reports no other wound issues. Otherwise  no complaints.  .  CURRENT MEDICATIONS  .  Taking Aspirin 81 MG Tablet Chewable 1 tablet Orally Once a day  Taking Calcium 600 MG Tablet 1 tablet Orally Twice a day  Taking Enbrel 50 MG/ML Solution 1 ml Subcutaneous once a week on  Friday  Taking Hydroxychloroquine Sulfate 200 MG Tablet TK 1 T PO Oral  Once a day  Taking PredniSONE 1 MG Tablet 4 tablets Orally Once a day, Notes:  alternates 4/5 mg qod  Taking Prilosec 20 MG Capsule Delayed Release 1 capsule Orally  Once a day  Taking Vitamin D 1000 UNIT Tablet 1 tablet Orally Once a day  Not-Taking/PRN Gabapentin 300 mg Capsule 1 capsule Oral hs  Not-Taking/PRN Tylenol  Medication List reviewed and reconciled with the patient  .  PAST MEDICAL HISTORY  .  Rheumatoid arthritis  .  ALLERGIES  .  N.K.D.A.  .  SURGICAL HISTORY  .  Left shoulder  Gallbladder  L5-S1 TLIF (transforaminal lumbar interbody fusion) with  bilateral screws 03/17/2018  Left Reverse total Shoulder Arthroplasty. 11/21/2018  .  FAMILY HISTORY  .  Arthritis.  .  SOCIAL HISTORY  .  .  Tobaccohistory:Never smoked.  .  Marital Status Married.  .  Alcohol Denies.  Marland Kitchen  HOSPITALIZATION/MAJOR DIAGNOSTIC PROCEDURE  .  see  surgeries  .  REVIEW OF SYSTEMS  .  ORT:  .  Eyes    No . Ear, Nose Throat    No . Digestion, Stomach, Bowel     No . Bladder Problems    No . Bleeding Problems    No .  Numbness/Tingling    No . Anxiety/Depression    No .  Fever/Chills/Fatigue    No . Chest Pain/Tightness/Palpitations     No . Skin Rash    No . Dental Problems    No . Joint/Muscle  Pain/Cramps    Yes . Blackout/Fainting    No . Other    No .  .  VITAL SIGNS  .  Pain scale 0, Ht-in 68.0, Ht-cm 172.72.  Marland Kitchen  PHYSICAL EXAMINATION  .  NAD, AOx3Shoulder incision healing well, no erythema, small 1mm  ulceration at proximal most extent of wound consistent with  suture abscess, otherwise no drainage, able to forward elevate to  110, ER 15, deferred IR, NVI.  Marland Kitchen  ASSESSMENTS  .  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  Status post reverse total replacement of left shoulder - [id number]  .  doing well, small suture reaction over incision.  .  TREATMENT  .  Arthritis of left shoulder region  Notes:  Sutures were removed, steri-strips applied, discussed  wound care, discussed dental prophylaxis prior to exams for at  least the next year, continue PT, follow up at 6 months post-op.  .  FOLLOW UP  .  4 Months with xrays left shoulder  .  Marland Kitchen  Appointment Provider: Randel Pigg  .  Electronically signed by Yetta Numbers , MD on  01/26/2019 at 07:59 AM EDT  .  CONFIRMATORY SIGN OFF  .  Marland Kitchen  Document electronically signed by Randel Pigg    .

## 2019-02-01 ENCOUNTER — Ambulatory Visit

## 2019-03-21 ENCOUNTER — Ambulatory Visit: Admitting: Specialist

## 2019-03-21 NOTE — Progress Notes (Signed)
* * *      KLEVER, TWYFORD **DOB:** 09-02-1955 (63 yo M) **Acc No.** [id number] **DOS:**  03/21/2019    ---       [name]**    ------    70 Y old Male, DOB: 1956-03-11    [address], [place], [place] [address]    Home: [phone]    Provider: Lorna Few        * * *    Telephone Encounter    ---    Answered by  Glori Bickers Date: 03/21/2019       Time: 01:15 PM    Caller  Patient    ------            Reason  ? LESI            Message                     Patient was booked for an appointment in [place] tomorrow however when I called he mentioned he is going to have an injection.  Looking back at his notes, he has had LESI in the past. He will cancel his appointment in this office and wait for [facility] to call with appointment.  Thank you.                    * * *                ---          * * *         Provider: Lorna Few 03/21/2019    ---    Note generated by eClinicalWorks EMR/PM Software ([email or web address])

## 2019-04-20 ENCOUNTER — Ambulatory Visit: Admit: 2019-04-20 | Payer: Medicare Other

## 2019-04-20 ENCOUNTER — Ambulatory Visit: Admitting: Specialist

## 2019-04-20 MED ORDER — Diclofenac Sodium: 1 | 100 | Freq: Two times a day (BID) | 6 refills | 0 days | Status: AC

## 2019-04-20 MED ORDER — Ketoprofen 15%, cyclobenzaprine 2%, gabapentin 6%, magnesium chloride 10%, guaifenesin 2%, lidocaine 4%, prilocaine 1%: 100 | Freq: Three times a day (TID) | 0 refills | 0 days | Status: AC

## 2019-04-20 NOTE — Progress Notes (Signed)
 .  Progress Notes  .  Patient: [name] [name]  Provider: Lorna Few    .  DOB: 1955/09/30 Age: 63 Y Sex: Male  .  PCP: Verda Cumins MD  Date: 04/20/2019  .  --------------------------------------------------------------------------------  .  REASON FOR APPOINTMENT  .  1. Telehealth / Video - Follow up  .  2. [phone]  .  3. Low back and right buttock pain  .  HISTORY OF PRESENT ILLNESS  .  Ambulatory Falls and Injury Prevention:  Denies : -  .  .   Due to current pandemic, per federal and state recommendations  during the COVID pandemic, patient was evaluated using  telehealth. Patient consented to this virtual encounter as a  substitution for an in-person visit. This visit occurred in  real-time, interactive virtual telehealth encounter, which was  conducted using AUDIO and VIDEO technology. I spent a total of 15  minutes during this real -time interactive virtual telehealth  encounter with greater than 50% of the time spent was devoted to  counseling and coordinating care including review of records,  pertinent lab data and studies , as well as discussing diagnostic  evaluation , options of therapeutic interventions and future  disposition of care. The patient conducted the visit from home  and the provider ( Dr Desillier) conducted the visit remotely  from outside the hospital , between 10:00-1015 . Patient is a  lovely59 yo gentleman known to me for lumbar radicular pain  injected in [facility] 12/22/2017 - lumbar epidural  steroid injection with 70% pain relief. Hesubsequently underwent  TLIF L5S1 on 02/2018( and more recently reverse TSA with Dr  Rodman Pickle ). He continues to do home exercises/stretches and rowing  and overall he feels great except for one area of pain on the  right side of the lower back tht has been bothering him even  before surgery on and off. The area is described ( and shown) on  the right side of the lower back , above and bit medial to iliac  creast with no  radiating pain down the leg. Heat and stretches  seem to temporary improve it.  .  CURRENT MEDICATIONS  .  Taking Aspirin 81 MG Tablet Chewable 1 tablet Orally Once a day  Taking Calcium 600 MG Tablet 1 tablet Orally Twice a day  Taking Enbrel 50 MG/ML Solution 1 ml Subcutaneous once a week on  Friday  Taking Hydroxychloroquine Sulfate 200 MG Tablet TK 1 T PO Oral  Once a day  Taking PredniSONE 1 MG Tablet 4 tablets Orally Once a day, Notes:  alternates 4/5 mg qod  Taking Prilosec 20 MG Capsule Delayed Release 1 capsule Orally  Once a day  Taking Tylenol  Taking Vitamin D 1000 UNIT Tablet 1 tablet Orally Once a day  Medication List reviewed and reconciled with the patient  .  PAST MEDICAL HISTORY  .  Rheumatoid arthritis  .  ALLERGIES  .  N.K.D.A.  .  SURGICAL HISTORY  .  Left shoulder  Gallbladder  L5-S1 TLIF (transforaminal lumbar interbody fusion) with  bilateral screws 03/17/2018  Left Reverse total Shoulder Arthroplasty. 11/21/2018  .  FAMILY HISTORY  .  Arthritis.  .  SOCIAL HISTORY  .  .  Tobaccohistory:Never smoked.  .  Alcohol Denies.  .  Illicit drugs: Denies.  Marland Kitchen  HOSPITALIZATION/MAJOR DIAGNOSTIC PROCEDURE  .  see surgeries  .  REVIEW OF SYSTEMS  .  Pain Management:  .  Constitutional:  Denies fever, chills . Cardiac:    Denies  chest pain . Pulmonary:    Denies shortness of breath . GI:     Denies bowel dysfunction . GU:    Denies bladder dysfunction .  Hematologic:    Denies anticoagulant therapy . Neurologic:     Denies numbness or weakness .  Marland Kitchen  VITAL SIGNS  .  Pain scale 5, Ht-in 68, Wt-lbs 190, BMI 28.89, Ht-cm 172.72,  Wt-kg 86.18.  .  PHYSICAL EXAMINATION  .  Pain Management:  Mental Status:  Speech, language, affect and cognition are  normal. Motor:  There is normal muscle tone.. Sensory:  Sensation  is intact to light touch in all sensory dermatomes in upper and  lower extremities. Gait:  Gait is normal. Heel to toe walking is  normal. Musculoskeletal Lumbar Spine:  Mild tenderness  to  palpation inright lumbar paravertebral and quadratus musculature.  Good ROM of lumbar spine.Scar well healed No pain with forward  flexion, extension, lateral flexion. Negative straight leg  raising.  .  ASSESSMENTS  .  Myofascial pain - M79.18 (Primary)  .  Low back pain - M54.5  .  Other chronic pain - G89.29  .  TREATMENT  .  Myofascial pain  Start Diclofenac Sodium Gel, 1 %, as directed, Transdermal, 2g  Twice a day, 30 days, 100, Refills 6  Start Ketoprofen 15%, cyclobenzaprine 2%, gabapentin 6%,  magnesium chloride 10%, guaifenesin 2%, lidocaine 4%, prilocaine  1% Cream, Apply 1-3 grams (1-3 pumps), topically to affected  area, 2-3 times a day, 30 days, 100  Notes: [age] yo gentleman wiht most likely myofascial  dominant pain in the right lower back - options to address it  discussed  He has TENS unit at home and I strongly advised him on using it  Topical diclofenac and compounded cream ordered today and we  discussed the use and benefits of both ( not sure if his  insurance will cover the compounded therefore diclofenac is  easier to obtain )  .  Marland Kitchen  FOLLOW UP  .  4 Weeks (Reason: follow up)  .  Electronically signed by Lorna Few , MD on  04/20/2019 at 10:46 AM EST  .  Document electronically signed by Lorna Few    .

## 2019-04-20 NOTE — Progress Notes (Signed)
 [name] [name] **DOB:** Aug 06, 1955 (63 yo M) **Acc No.** [id number] **DOS:**  04/20/2019    ---       [name] [name]**    ------    [name], DOB: 17-Mar-1956, External MRN: [id number]    Account Number: [id number]    [address], [place], [place]    Home: [phone]    Insurance: NH[employer] OUT [employer]    PCP: Verda Cumins, MD Referring: Verda Cumins, MD    [facility]        * * *    04/20/2019 Progress Notes: Esperansa Sarabia. Lorna Few, MD **CHN#:** [phone] [phone]    ------    ---       **Reason for Appointment**    ---      1\. Telehealth / Video - Follow up    ---    2\. [phone]    ---    3\. Low back and right buttock pain    ---      **History of Present Illness**    ---     _Ambulatory Falls and Injury Prevention_ :    Denies : -.    Due to current pandemic, per federal and state recommendations during the  COVID pandemic, patient was evaluated using telehealth. Patient consented to  this virtual encounter as a substitution for an in-person visit. This visit  occurred in real-time, interactive virtual telehealth encounter, which was  conducted using AUDIO and VIDEO technology. I spent a total of 15 minutes  during this real -time interactive virtual telehealth encounter with greater  than 50% of the time spent was devoted to counseling and coordinating care  including review of records, pertinent lab data and studies , as well as  discussing diagnostic evaluation , options of therapeutic interventions and  future disposition of care. The patient conducted the visit from home and the  provider ( Dr Brea Coleson) conducted the visit remotely from outside the  hospital , between 10:00-1015 .    Patient is a lovely63 yo gentleman known to me for lumbar radicular pain  injected in [facility] 12/22/2017 - lumbar epidural steroid injection  with 70% pain relief. Hesubsequently underwent TLIF L5S1 on 02/2018( and more  recently reverse TSA with Dr Rodman Pickle  ). He continues to do home  exercises/stretches and rowing and overall he feels great except for one area  of pain on the right side of the lower back tht has been bothering him even  before surgery on and off. The area is described ( and shown) on the right  side of the lower back , above and bit medial to iliac creast with no  radiating pain down the leg. Heat and stretches seem to temporary improve it.      **Current Medications**    ---    Taking    * Aspirin 81 MG Tablet Chewable 1 tablet Orally Once a day    ---    * Calcium 600 MG Tablet 1 tablet Orally Twice a day    ---    * Enbrel 50 MG/ML Solution 1 ml Subcutaneous once a week on Friday    ---    * Hydroxychloroquine Sulfate 200 MG Tablet TK 1 T PO Oral Once a day    ---    * PredniSONE 1 MG Tablet 4 tablets Orally Once a day, Notes: alternates 4/5 mg qod    ---    * Prilosec  20 MG Capsule Delayed Release 1 capsule Orally Once a day    ---    * Tylenol     ---    * Vitamin D 1000 UNIT Tablet 1 tablet Orally Once a day    ---    * Medication List reviewed and reconciled with the patient    ---      **Past Medical History**    ---      Rheumatoid arthritis.        ---      **Surgical History**    ---      Left shoulder    ---    Gallbladder    ---    L5-S1 TLIF (transforaminal lumbar interbody fusion) with bilateral screws  03/17/2018    ---    Left Reverse total Shoulder Arthroplasty. 11/21/2018    ---      **Family History**    ---      Arthritis.    ---      **Social History**    ---    Tobacco  history: Never smoked.    Alcohol  Denies.    Illicit drugs: Denies.     **Allergies**    ---      N.K.D.A.    ---      **Hospitalization/Major Diagnostic Procedure**    ---      see surgeries    ---     **Review of Systems**    ---     _Pain Management_ :    Constitutional: Denies fever, chills. Cardiac: Denies chest pain. Pulmonary:  Denies shortness of breath. GI: Denies bowel dysfunction. GU: Denies bladder  dysfunction. Hematologic: Denies  anticoagulant therapy. Neurologic: Denies  numbness or weakness.         **Vital Signs**    ---    Pain scale 5, Ht-in 68, Wt-lbs 190, BMI 28.89, Ht-cm 172.72, Wt-kg 86.18.      **Physical Examination**    ---     _Pain Management_ :    Mental Status: Speech, language, affect and cognition are normal. Motor: There  is normal muscle tone.. Sensory: Sensation is intact to light touch in all  sensory dermatomes in upper and lower extremities. Gait: Gait is normal. Heel  to toe walking is normal. Musculoskeletal Lumbar Spine: Mild tenderness to  palpation inright lumbar paravertebral and quadratus musculature. Good ROM of  lumbar spine.Scar well healed No pain with forward flexion, extension, lateral  flexion. Negative straight leg raising.         **Assessments**    ---    1\. Myofascial pain - M79.18 (Primary)    ---    2\. Low back pain - M54.5    ---    3\. Other chronic pain - G89.29    ---      **Treatment**    ---      **1\. Myofascial pain**    Start Diclofenac Sodium Gel, 1 %, as directed, Transdermal, 2g Twice a day, 30  days, 100, Refills 6    Start Ketoprofen 15%, cyclobenzaprine 2%, gabapentin 6%, magnesium chloride  10%, guaifenesin 2%, lidocaine 4%, prilocaine 1% Cream, Apply 1-3 grams (1-3  pumps), topically to affected area, 2-3 times a day, 30 days, 100    Notes: [name] 63 yo gentleman wiht most likely myofascial dominant pain in the  right lower back - options to address it discussed    He has TENS unit at home and I strongly advised him on using it  Topical diclofenac and compounded cream ordered today and we discussed the use  and benefits of both ( not sure if his insurance will cover the compounded  therefore diclofenac is easier to obtain )    .    ---     **Follow Up**    ---    4 Weeks (Reason: follow up)    Electronically signed by Lorna Few , MD on 04/20/2019 at 10:46 AM EST    Sign off status: Completed        * * *        [facility]    [address]    [place], [address]    [place], [place] [address]    Tel: [phone]    Fax: [phone]              * * *          Progress Note: Jemmie Ledgerwood. Lorna Few, MD 04/20/2019    ---    Note generated by eClinicalWorks EMR/PM Software ([email or web address])

## 2019-05-07 ENCOUNTER — Ambulatory Visit: Admit: 2019-05-07 | Payer: Medicare Other

## 2019-05-07 ENCOUNTER — Ambulatory Visit: Admitting: Hand Surgery

## 2019-05-07 ENCOUNTER — Ambulatory Visit

## 2019-05-07 NOTE — Progress Notes (Signed)
 [name] [name] **DOB:** [birth year] (63 yo M) **Acc No.** [id number] **DOS:**  05/07/2019    ---       [name] [name]**    ------    [name], DOB: 01-Feb-1956, External MRN: [id number]    Account Number: [id number]    [address] [place], [place]    Home: [phone]    Insurance: NHP OUT IPA    PCP: Verda Cumins, MD Referring: Verda Cumins, MD    [facility]        * * *    05/07/2019  **Appointment Provider:** Lanell Persons    ------     **Supervising Provider:** Yetta Numbers, MD    ---       **Reason for Appointment**    ---      1\. 6 month f/u s/p L rTSA on 11/21/18    ---      **History of Present Illness**    ---     _GENERAL_ :    This is a 63 y/o M who is 6 montsh s/p the above procedure. He is doing very  well. He has no complaints with his shoulder. He is able to do all his  activites of daily living w/o issues. No new issues to report today. Here at  the visit with his wife.      **Current Medications**    ---    Taking    * Aspirin 81 MG Tablet Chewable 1 tablet Orally Once a day    ---    * Calcium 600 MG Tablet 1 tablet Orally Twice a day    ---    * Diclofenac Sodium 1 % Gel as directed Transdermal 2g Twice a day    ---    * Enbrel 50 MG/ML Solution 1 ml Subcutaneous once a week on Friday    ---    * Hydroxychloroquine Sulfate 200 MG Tablet TK 1 T PO Oral Once a day    ---    * Ketoprofen 15%, cyclobenzaprine 2%, gabapentin 6%, magnesium chloride 10%, guaifenesin 2%, lidocaine 4%, prilocaine 1% Cream Apply 1-3 grams (1-3 pumps) topically to affected area 2-3 times a day    ---    * PredniSONE 1 MG Tablet 4 tablets Orally Once a day, Notes: alternates 4/5 mg qod    ---    * Prilosec 20 MG Capsule Delayed Release 1 capsule Orally Once a day    ---    * Tylenol     ---    * Vitamin D 1000 UNIT Tablet 1 tablet Orally Once a day    ---    * Medication List reviewed and reconciled with the patient    ---       **Past Medical History**    ---      Rheumatoid arthritis.        ---      **Surgical History**    ---      Left shoulder    ---    Gallbladder    ---    L5-S1 TLIF (transforaminal lumbar interbody fusion) with bilateral screws  03/17/2018    ---    Left Reverse total Shoulder Arthroplasty. 11/21/2018    ---      **Family History**    ---      Arthritis.    ---      **Social History**    ---  Tobacco  history: Never smoked.    Alcohol  Denies.    Illicit drugs: Denies.     **Allergies**    ---      N.K.D.A.    ---      **Hospitalization/Major Diagnostic Procedure**    ---      see surgeries    ---     **Review of Systems**    ---     _ORT_ :    Eyes No. Ear, Nose Throat No. Digestion, Stomach, Bowel No. Bladder Problems  No. Bleeding Problems No. Numbness/Tingling No. Anxiety/Depression No.  Fever/Chills/Fatigue No. Chest Pain/Tightness/Palpitations No. Skin Rash No.  Dental Problems No. Joint/Muscle Pain/Cramps Yes. Blackout/Fainting No. Other  No.         **Vital Signs**    ---    Pain scale 0, Ht-in 68, Ht-cm 172.72.      **Examination**    ---     _Head:_    General Well-appearing, No acute distress.         **Physical Examination**    ---    LUE- incisino anterior shoulder healed, no TTP    ROM 100 abduction, 120 FF, 45 ER, 25 IR    5/5 strength all muscle groups of the RUE    sensation intact to light touch all nerve distributions, all fingers warm and  palpable radial pulse    XR L shoulder- reviewed today shows prosthesis in place, no interval changes  noted from prior XR.      **Assessments**    ---    1\. 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 Status post reverse total replacement of left shoulder - Z61.096    ---     doing well, small suture reaction over incision.    ---      **Treatment**    ---      **1\. Arthritis of left shoulder region**    Notes: The patient was seen and examined with Dr. Rodman Pickle today. He is doing  great overall and can continue his  activities as tolerated. He was reminded to  use abx ppx for procedures such as dental, urological and colonoscopy to help  prevent infections. The patient will f/u at the one year mark for repeat XR.  He will call us sooner if any issues arise. He understands this treatment  plan.    ---     **Follow Up**    ---    6 Months    **Appointment Provider:** Lanell Persons    Electronically signed by Yetta Numbers , MD on 06/01/2019 at 03:32 PM EST    Sign off status: Completed        * * *        [facility]    [address]    [place], [address]    [place], [place] [address]    Tel: [phone]    Fax: [phone]              * * *          Progress Note: Lanell Persons 05/07/2019    ---    Note generated by eClinicalWorks EMR/PM Software ([email or web address])

## 2019-05-07 NOTE — Progress Notes (Signed)
 .  Progress Notes  .  Patient: [name] [name]  Provider: Lanell Persons  DOB:1955-07-13 Age: 63 Y Sex: Male  Supervising Provider:: Yetta Numbers, MD  Date: 05/07/2019  .  PCP: Verda Cumins MD  Date: 05/07/2019  .  --------------------------------------------------------------------------------  .  REASON FOR APPOINTMENT  .  1. 6 month f/u s/p L rTSA on 11/21/18  .  HISTORY OF PRESENT ILLNESS  .  GENERAL:   This is a 63 y/o M who is 65 montsh s/p the above procedure. He  is doing very well. He has no complaints with his shoulder. He is  able to do all his activites of daily living w/o issues. No new  issues to report today. Here at the visit with his wife.  .  CURRENT MEDICATIONS  .  Taking Aspirin 81 MG Tablet Chewable 1 tablet Orally Once a day  Taking Calcium 600 MG Tablet 1 tablet Orally Twice a day  Taking Diclofenac Sodium 1 % Gel as directed Transdermal 2g Twice  a day  Taking Enbrel 50 MG/ML Solution 1 ml Subcutaneous once a week on  Friday  Taking Hydroxychloroquine Sulfate 200 MG Tablet TK 1 T PO Oral  Once a day  Taking Ketoprofen 15%, cyclobenzaprine 2%, gabapentin 6%,  magnesium chloride 10%, guaifenesin 2%, lidocaine 4%, prilocaine  1% Cream Apply 1-3 grams (1-3 pumps) topically to affected area  2-3 times a day  Taking PredniSONE 1 MG Tablet 4 tablets Orally Once a day, Notes:  alternates 4/5 mg qod  Taking Prilosec 20 MG Capsule Delayed Release 1 capsule Orally  Once a day  Taking Tylenol  Taking Vitamin D 1000 UNIT Tablet 1 tablet Orally Once a day  Medication List reviewed and reconciled with the patient  .  PAST MEDICAL HISTORY  .  Rheumatoid arthritis  .  ALLERGIES  .  N.K.D.A.  .  SURGICAL HISTORY  .  Left shoulder  Gallbladder  L5-S1 TLIF (transforaminal lumbar interbody fusion) with  bilateral screws 03/17/2018  Left Reverse total Shoulder Arthroplasty. 11/21/2018  .  FAMILY HISTORY  .  Arthritis.  .  SOCIAL HISTORY  .  .  Tobaccohistory:Never smoked.  .  Alcohol  Denies.  .  Illicit drugs: Denies.  Marland Kitchen  HOSPITALIZATION/MAJOR DIAGNOSTIC PROCEDURE  .  see surgeries  .  REVIEW OF SYSTEMS  .  ORT:  .  Eyes    No . Ear, Nose Throat    No . Digestion, Stomach, Bowel     No . Bladder Problems    No . Bleeding Problems    No .  Numbness/Tingling    No . Anxiety/Depression    No .  Fever/Chills/Fatigue    No . Chest Pain/Tightness/Palpitations     No . Skin Rash    No . Dental Problems    No . Joint/Muscle  Pain/Cramps    Yes . Blackout/Fainting    No . Other    No .  .  VITAL SIGNS  .  Pain scale 0, Ht-in 68, Ht-cm 172.72.  Marland Kitchen  EXAMINATION  .  Head:  GeneralWell-appearing, No acute distress.  .  PHYSICAL EXAMINATION  .  LUE- incisino anterior shoulder healed, no TTPROM 100 abduction,  120 FF, 45 ER, 25 IR5/5 strength all muscle groups of the  RUEsensation intact to light touch all nerve distributions, all  fingers warm and palpable radial pulseXR L shoulder- reviewed  today shows prosthesis in place, no interval changes noted from  prior XR.  Marland Kitchen  ASSESSMENTS  .  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  Status post reverse total replacement of left shoulder - [id number]  .  doing well, small suture reaction over incision.  .  TREATMENT  .  Arthritis of left shoulder region  Notes: The patient was seen and examined with Dr. Rodman Pickle today.  He is doing great overall and can continue his activities as  tolerated. He was reminded to use abx ppx for procedures such as  dental, urological and colonoscopy to help prevent infections.  The patient will f/u at the one year mark for repeat XR. He will  call us sooner if any issues arise. He understands this treatment  plan.  .  FOLLOW UP  .  6 Months  .  Marland Kitchen  Appointment Provider: Lanell Persons  .  Electronically signed by Yetta Numbers , MD on  06/01/2019 at 03:32 PM EST  .  CONFIRMATORY SIGN OFF  .  Marland Kitchen  Document electronically signed by Lanell Persons    .

## 2019-05-08 ENCOUNTER — Observation Stay: Admission: RE | Admit: 2019-05-08 | Payer: HMO

## 2019-05-22 ENCOUNTER — Ambulatory Visit: Admit: 2019-05-22 | Payer: HMO

## 2019-05-22 ENCOUNTER — Ambulatory Visit: Admitting: Specialist

## 2019-05-22 ENCOUNTER — Ambulatory Visit (HOSPITAL_BASED_OUTPATIENT_CLINIC_OR_DEPARTMENT_OTHER): Admitting: Psychiatry

## 2019-05-22 MED ORDER — Ketoprofen 15%, cyclobenzaprine 2%, gabapentin 6%, magnesium chloride 10%, guaifenesin 2%, lidocaine 4%, prilocaine 1%: 100 | Freq: Three times a day (TID) | 10 refills | 0 days

## 2019-05-22 NOTE — Progress Notes (Signed)
 .  Progress Notes  .  Patient: [name] [name]  Provider: Lorna Few    .  DOB: [birth year] Age: 63 Y Sex: Male  .  PCP: Verda Cumins MD  Date: 05/22/2019  .  --------------------------------------------------------------------------------  .  REASON FOR APPOINTMENT  .  1. F/U  .  2. 12/21 LVM  .  HISTORY OF PRESENT ILLNESS  .  GENERAL:   Due to current pandemic, per federal and state recommendations  during the COVID pandemic, patient was evaluated using  telehealth. Patient consented to this virtual encounter as a  substitution for an in-person visit. This visit occurred in  real-time, interactive virtual telehealth encounter, which was  conducted using AUDIO technology. I spent a total of 10 minutes  during this real -time interactive virtual telehealth encounter  with greater than 50% of the time spent was devoted to counseling  and coordinating care including review of records, pertinent lab  data and studies , as well as discussing diagnostic evaluation ,  options of therapeutic interventions and future disposition of  care. The patient conducted the visit from home and the provider  ( Dr Desillier) conducted the visit remotely from the hospital ,  between 13:00-13 . Patient is a lovely62 yo gentleman known to me  for lumbar radicular pain injected in [facility]  12/22/2017 - lumbar epidural steroid injection with 70% pain  relief. Hesubsequently underwent TLIF L5S1 on 02/2018( and more  recently reverse TSA with Dr Rodman Pickle ). He continues to do home  exercises/stretches and rowing and overall he feels great except  for one area of pain on the right side of the lower back tht has  been bothering him even before surgery on and off. The area was  described ( and shown) on the right side of the lower back ,  above and bit medial to iliac creast with no radiating pain down  the leg. Heat and stretches seem to temporary improve it At last  visit we greed to try compounded cream ( diclofenac  written just  in case compounded cream would not be covered , he tried but did  not help ) Overall he feels much better and did not even try the  TENS , using exclusively the compounded cream.  .  Ambulatory Falls and Injury Prevention:  Denies : -  .  .  CURRENT MEDICATIONS  .  Unknown Aspirin 81 MG Tablet Chewable 1 tablet Orally Once a day  Unknown Calcium 600 MG Tablet 1 tablet Orally Twice a day  Unknown Diclofenac Sodium 1 % Gel as directed Transdermal 2g  Twice a day  Unknown Enbrel 50 MG/ML Solution 1 ml Subcutaneous once a week on  Friday  Unknown Hydroxychloroquine Sulfate 200 MG Tablet TK 1 T PO Oral  Once a day  Unknown Ketoprofen 15%, cyclobenzaprine 2%, gabapentin 6%,  magnesium chloride 10%, guaifenesin 2%, lidocaine 4%, prilocaine  1% Cream Apply 1-3 grams (1-3 pumps) topically to affected area  2-3 times a day  Unknown PredniSONE 1 MG Tablet 4 tablets Orally Once a day,  Notes: alternates 4/5 mg qod  Unknown Prilosec 20 MG Capsule Delayed Release 1 capsule Orally  Once a day  Unknown Tylenol  Unknown Vitamin D 1000 UNIT Tablet 1 tablet Orally Once a day  .  PAST MEDICAL HISTORY  .  Rheumatoid arthritis  .  ALLERGIES  .  N.K.D.A.  .  SURGICAL HISTORY  .  Left shoulder  Gallbladder  L5-S1 TLIF (  transforaminal lumbar interbody fusion) with  bilateral screws 03/17/2018  Left Reverse total Shoulder Arthroplasty. 11/21/2018  .  FAMILY HISTORY  .  Arthritis.  .  SOCIAL HISTORY  .  .  Tobaccohistory:Never smoked.  .  Alcohol Denies.  .  Illicit drugs: Denies.  Marland Kitchen  HOSPITALIZATION/MAJOR DIAGNOSTIC PROCEDURE  .  see surgeries  .  REVIEW OF SYSTEMS  .  Pain Management:  .  Constitutional:    Denies fever, chills . Cardiac:    Denies  chest pain . Pulmonary:    Denies shortness of breath . GI:     Denies bowel dysfunction . GU:    Denies bladder dysfunction .  Hematologic:    Denies anticoagulant therapy . Neurologic:     Denies numbness or weakness .  Marland Kitchen  PHYSICAL EXAMINATION  .  Pain Management:  Mental Status:   Speech, language, affect and cognition are  normal. Motor:  unchjanged. Sensory:  unchanged. Gait:  Gait is  normal. Heel to toe walking is normal. Musculoskeletal Lumbar  Spine:  No tenderness to palpation inright lumbar paravertebral  and quadratus musculature. Good ROM of lumbar spine..  .  ASSESSMENTS  .  Myofascial pain - M79.18 (Primary)  .  TREATMENT  .  Myofascial pain  Refill Ketoprofen 15%, cyclobenzaprine 2%, gabapentin 6%,  magnesium chloride 10%, guaifenesin 2%, lidocaine 4%, prilocaine  1% Cream, Apply 1-3 grams (1-3 pumps), topically to affected  area, 2-3 times a day, 30 days, 100, Refills 10  Notes: Improved with onoy topical compounded cream ( now using it  twice a day ).  Marland Kitchen  PROCEDURE CODES  .  [id number] PHONE E/M BY PHYS 5-10 MIN  .  FOLLOW UP  .  prn  .  Electronically signed by Lorna Few , MD on  05/22/2019 at 01:20 PM EST  .  Document electronically signed by Lorna Few    .

## 2019-05-22 NOTE — Progress Notes (Signed)
 [name] [name] **DOB:** Apr 11, 1956 (63 yo M) **Acc No.** [id number] **DOS:**  05/22/2019    ---       [name] [name]**    ------    [name], DOB: 1956/02/17, External MRN: [id number]    Account Number: [id number]    [address] [place], [place]    Home: [phone]    Insurance: NH[employer] OUT [employer]    PCP: [name] Cumins, MD Referring: [name] Cumins, MD    [facility]        * * *    05/22/2019 Progress Notes: [name] [name]. [name] Few, MD **CHN#:** [id number]    ------    ---       **Reason for Appointment**    ---      1\. F/U    ---    2\. 12/21 LVM    ---      **History of Present Illness**    ---     _GENERAL_ :    Due to current pandemic, per federal and state recommendations during the  COVID pandemic, patient was evaluated using telehealth. Patient consented to  this virtual encounter as a substitution for an in-person visit. This visit  occurred in real-time, interactive virtual telehealth encounter, which was  conducted using AUDIO technology. I spent a total of 10 minutes during this  real -time interactive virtual telehealth encounter with greater than 50% of  the time spent was devoted to counseling and coordinating care including  review of records, pertinent lab data and studies , as well as discussing  diagnostic evaluation , options of therapeutic interventions and future  disposition of care. The patient conducted the visit from home and the  provider ( Dr [name] [name]) conducted the visit remotely from the hospital ,  between 13:00-13 .    Patient is a lovely63 yo gentleman known to me for lumbar radicular pain  injected in [facility] 12/22/2017 - lumbar epidural steroid injection  with 70% pain relief. Hesubsequently underwent TLIF L5S1 on 02/2018( and more  recently reverse TSA with Dr [name] [name] ). He continues to do home  exercises/stretches and rowing and overall he feels great except for one area  of pain on the right side of the  lower back tht has been bothering him even  before surgery on and off. The area was described ( and shown) on the right  side of the lower back , above and bit medial to iliac creast with no  radiating pain down the leg. Heat and stretches seem to temporary improve it    At last visit we greed to try compounded cream ( diclofenac written just in  case compounded cream would not be covered , he tried but did not help )    Overall he feels much better and did not even try the TENS , using exclusively  the compounded cream.     _Ambulatory Falls and Injury Prevention_ :    Denies : -.     **Current Medications**    ---    Unknown    * Aspirin 81 MG Tablet Chewable 1 tablet Orally Once a day    ---    * Calcium 600 MG Tablet 1 tablet Orally Twice a day    ---    * Diclofenac Sodium 1 % Gel as directed Transdermal 2g Twice a day    ---    * Enbrel 50 MG/ML Solution 1 ml  Subcutaneous once a week on Friday    ---    * Hydroxychloroquine Sulfate 200 MG Tablet TK 1 T PO Oral Once a day    ---    * Ketoprofen 15%, cyclobenzaprine 2%, gabapentin 6%, magnesium chloride 10%, guaifenesin 2%, lidocaine 4%, prilocaine 1% Cream Apply 1-3 grams (1-3 pumps) topically to affected area 2-3 times a day    ---    * PredniSONE 1 MG Tablet 4 tablets Orally Once a day, Notes: alternates 4/5 mg qod    ---    * Prilosec 20 MG Capsule Delayed Release 1 capsule Orally Once a day    ---    * Tylenol     ---    * Vitamin D 1000 UNIT Tablet 1 tablet Orally Once a day    ---      **Past Medical History**    ---      Rheumatoid arthritis.        ---      **Surgical History**    ---      Left shoulder    ---    Gallbladder    ---    L5-S1 TLIF (transforaminal lumbar interbody fusion) with bilateral screws  03/17/2018    ---    Left Reverse total Shoulder Arthroplasty. 11/21/2018    ---      **Family History**    ---      Arthritis.    ---      **Social History**    ---    Tobacco  history: Never smoked.    Alcohol  Denies.    Illicit drugs:  Denies.     **Allergies**    ---      N.K.D.A.    ---      **Hospitalization/Major Diagnostic Procedure**    ---      see surgeries    ---     **Review of Systems**    ---     _Pain Management_ :    Constitutional: Denies fever, chills. Cardiac: Denies chest pain. Pulmonary:  Denies shortness of breath. GI: Denies bowel dysfunction. GU: Denies bladder  dysfunction. Hematologic: Denies anticoagulant therapy. Neurologic: Denies  numbness or weakness.         **Physical Examination**    ---     _Pain Management_ :    Mental Status: Speech, language, affect and cognition are normal. Motor:  unchjanged. Sensory: unchanged. Gait: Gait is normal. Heel to toe walking is  normal. Musculoskeletal Lumbar Spine: No tenderness to palpation inright  lumbar paravertebral and quadratus musculature. Good ROM of lumbar spine..         **Assessments**    ---    1\. Myofascial pain - M79.18 (Primary)    ---      **Treatment**    ---      **1\. Myofascial pain**    Refill Ketoprofen 15%, cyclobenzaprine 2%, gabapentin 6%, magnesium chloride  10%, guaifenesin 2%, lidocaine 4%, prilocaine 1% Cream, Apply 1-3 grams (1-3  pumps), topically to affected area, 2-3 times a day, 30 days, 100, Refills 10    Notes: Improved with onoy topical compounded cream ( now using it twice a day  ).    ---     **Procedure Codes**    ---      [phone] PHONE [name] [name]/M BY PHYS 5-10 MIN    ---      **Follow Up**    ---    prn    Electronically signed by [name] [name]  [name] [name] , MD on 05/22/2019 at 01:20 PM EST    Sign off status: Completed        * * *        [facility]    [address]    [place], [address]    [place], [place] [address]    Tel: [phone]    Fax: [phone]              * * *          Progress Note: [name] [name]. [name] Few, MD 05/22/2019    ---    Note generated by eClinicalWorks EMR/PM Software ([email or web address])

## 2020-01-08 ENCOUNTER — Ambulatory Visit: Admitting: Pain Medicine

## 2020-01-08 NOTE — Progress Notes (Signed)
* * *      [name] [name], [name] [name] **DOB:** 01/06/1956 (64 yo M) **Acc No.** [id number] **DOS:**  01/08/2020    ---       [name] [name]**    ------    89 Y old Male, DOB: Jan 27, 1956    [address], [place], [place] [address]    Home: [phone]    Provider: Gilda Crease        * * *    Telephone Encounter    ---    Answered by  Rush Barer Date: 01/08/2020       Time: 09:16 AM    Message                     Refill request from patient's pharmacy for diclofenac gel.  Reviewed with Dr. Iran Sizer and 1 refill sent.  Message left for patient to call office if additional refills are needed to make an appointment with Dr. Rod Mae.  Order faxed to [facility].        ------                * * *                ---          * * *         Provider: Iran Sizer, Kirke Shaggy 01/08/2020    ---    Note generated by eClinicalWorks EMR/PM Software ([email or web address])

## 2020-01-09 ENCOUNTER — Ambulatory Visit

## 2020-08-26 NOTE — Progress Notes (Signed)
* * *        [name] [name]    --- ---    77 Y old Male, DOB: 13-Nov-1955, External MRN: [id number]    Account Number: [id number]    [address] [place], [place]    Home: [phone]    Insurance: NHP OUT IPA Payer ID: PAPER    PCP: Verda Cumins, MD Referring: Verda Cumins, MD External Visit ID:  [id number]    Appointment Facility: Neurosurgery        * * *    11/17/2017  Progress Notes: [name], MD **CHN#:** [id number]    --- ---    ---         **Current Medications**    ---    Taking     * Aspirin 81 MG Tablet Chewable 1 tablet Orally Once a day    ---    * Calcium 600 MG Tablet 1 tablet Orally Twice a day    ---    * Enbrel 50 MG/ML Solution 1 ml Subcutaneous once a week on Friday    ---    * Fish Oil 1200 MG Capsule 1 capsule Orally Once a day    ---    * Gabapentin 300 MG Capsule Oral     ---    * Hydroxychloroquine Sulfate 200 MG Tablet TK 1 T PO Oral Once a day    ---    * PredniSONE 1 MG Tablet 4 tablets Orally Once a day    ---    * Prilosec 20 MG Capsule Delayed Release 1 capsule Orally Once a day    ---    * Tramadol HCl 50 MG Tablet (Schedule IV Drug) TK 1 T PO Q 8 H PRN P 4-6 Oral     ---    * Tylenol     ---    * Vitamin D 1000 UNIT Tablet 1 tablet Orally Once a day    ---    * Medication List reviewed and reconciled with the patient    ---      Past Medical History    ---       Rheumatoid arthritis.        ---       **Surgical History**    ---       Left shoulder    ---    Gallbladder    ---      **Social History**    ---    Tobacco history: Never smoked.      **Allergies**    ---       N.K.D.A.    ---    Forrestine Him Verified]       **Hospitalization/Major Diagnostic Procedure**    ---       Denies Past Hospitalization    ---       **Review of Systems**    ---     _GENERAL_ :    Negative for: weight loss within the past year,, weight gain within the past  year,, chest pain,, irregular heart beats,, heart murmurs,, nausea /  vomiting,, easy bruising,, fever / chills,,  frequent nose bleeds,, hoarse  voice,, frequent headaches,, shortness of breath,, breast discharge,, kidney  disease,, liver disease,, previous anesthesiea problems,, excess bleeding  during or after prior procedure,.    _NEUROLOGY_ :    Positive for: back pain. Negative for: weakness, ringing in the ears,, trouble  with balance and coordination,, hearing loss,, vision loss in one eye or  the  other,, difficulty swallowing,, difficulty swallowing,, double vision,, loss  of smell,.            **Reason for Appointment**    ---       1\. Low back pain and right leg pain    ---       **History of Present Illness**    ---     _NEUROSURGERY_ :    65 year old male presents in initial neurosurgical consultation with lower  back pain and right leg pain for the past six months. The pain is located in  the right buttocks (mostly buttocks), skips the thigh and then goes into the  lateral calf into the plantar surface of the foot to the plantar surface of  his right great toe. He actually tore two tendons in the left ankle getting up  from a rowing machine and was undergoing physical therapy for that. He wore a  boot and his gait was altered. He then developed low back pain and right leg  pain and has been going to physical therapy for that. He was initially  diagnosed with piriformis syndrome until MRI demonstrated the  spondylolisthesis, spinal stenosis, and synovial cyst compressing the right L5  nerve root. He did see a neurosurgeon at [facility] who recommended  fusion surgery. He is here for second opinion. He has history of rheumatoid  arthritis and was started on Enbrel 2-3 months ago. He also takes prednisone.  He has had PT for six months for his low back without relief. He cannot  tolerate NSAIDS due to his being on prednisone for his rheumatoid arthritis.     _Associated Providers_ :    Primary Care Provider Verda Cumins, MD .      **Vital Signs**    ---    Pain scale 5, Wt-lbs 200, BP 161/95, HR 102.        **Physical Examination**    ---     _NEUROSURGERY_ :    MOTOR Lower Extremities : 5/5 strength in the bilateral lower extremities  except:, Right EHL 4/5. Reflexes Left Knee Jerk 2+, Right Knee Jerk 2+, Left  Ankle Jerk 2+, Right Ankle Jerk 2+. Gait : uses a cane for assistance.  Babinski : Negative. Ankle Clonus : Negative.    _DIAGNOSTIC STUDIES_ :    RADIOLOGY I reviewed ap/lateral/flexion/extension xrays completed 11/17/17 at  [facility].Marland Kitchen MRI Uploaded to PACS. I reviewed MRI lumbar spine perfomed on 10/14/17  at [facility].          **Assessments**    ---    1\. Spondylolisthesis of lumbosacral region - M43.17 (Primary)    ---    2\. 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 There is also a synovial cyst  at right L5-S1 as well causing nerve compression. He has some weakness in the  left EHL as well. I discussed options with him including continued PT,  epidural steroid injections or surgical intervention. I believe that he would  benefit symptomatically from right L5-S1 TLIF (transforaminal lumbar interbody  fusion).    Dr. Melvenia Beam Helfgott informed patient that he would need to be off Enbrel for  one week prior to surgery and two weeks postoperatively. He told him that as  long as he is on less than 5 mg he can remain on prednisone throughout the  perioperative period.    His daughter is getting married on 02/20/18 in [place]. I recommended that he  undergo a lumbar epidural steroid injection in the meantime for his severe  right leg pain. I will refer him to Dr. Reece Leader in [place] for epidural  steroid injection. He will followup at least ten days after the injection to  see if it helps. If it doesn't will consider surgery.    ---       **Follow Up**    ---    after ESI    Electronically signed by [name] , MD on 11/17/2017 at 06:20 PM EDT    Sign off status: Completed        * *  *        Neurosurgery    [address] [place], [address]    [place], [place] [address]    Tel: [phone]    Fax: [phone]              * * *          Patient: [name] [name], [name] [name] DOB: 1955-09-27 Progress Note: [name], MD  11/17/2017    ---    Note generated by eClinicalWorks EMR/PM Software ([email or web address])

## 2020-08-26 NOTE — Progress Notes (Signed)
* * *        **  [name] [name]**    --- ---    56 Y old Male, DOB: 1955-07-26    [address] [place], [place] [address]    Home: [phone]    Provider: Lorna Few        * * *    Telephone Encounter    ---    Answered by   Lorna Few  Date: 01/12/2018         Time: 11:22 AM    Reason   injection appt    --- ---            Message                      Asencion Islam, could you please schedule [name] at [place] next week 9 8/22 ) for bilateral MBb 2 levels. Thank you!                Action Taken                      Aloha Bartok,ADRIANA  01/12/2018 11:22:58 AM >       Correia,Jennifer  01/16/2018 1:44:57 PM > patient call back looking to see if this was confirmed or not can someone please call him? cb#[phone]      Correia,Jennifer  01/18/2018 2:04:16 PM > pt called again, new encounter sent to [facility].                     * * *                ---          * * *          Patient: [name] [name] DOB: 30-Jun-1955 Provider: Lorna Few  01/12/2018    ---    Note generated by eClinicalWorks EMR/PM Software ([email or web address])

## 2020-08-26 NOTE — Progress Notes (Signed)
* * *        [name] [name]    --- ---    42 Y old Male, DOB: [birth year], External MRN: [id number]    Account Number: [id number]    [address], [place], [place]    Home: [phone]    Insurance: NHP OUT IPA Payer ID: PAPER    PCP: Verda Cumins, MD Referring: Verda Cumins, MD External Visit ID:  [id number]    Appointment Facility: Neurosurgery        * * *    02/09/2018  Progress Notes: [name], MD **CHN#:** [id number]    --- ---    ---         **Current Medications**    ---    Taking     * Aspirin 81 MG Tablet Chewable 1 tablet Orally Once a day    ---    * Calcium 600 MG Tablet 1 tablet Orally Twice a day    ---    * Enbrel 50 MG/ML Solution 1 ml Subcutaneous once a week on Friday    ---    * Fish Oil 1200 MG Capsule 1 capsule Orally Once a day    ---    * Gabapentin 300 mg Capsule 1 capsule Oral hs, Notes: takes only when he cannot sleept    ---    * Hydroxychloroquine Sulfate 200 MG Tablet TK 1 T PO Oral Once a day    ---    * PredniSONE 1 MG Tablet 4 tablets Orally Once a day, Notes: alternates 4/5 mg qod    ---    * Prilosec 20 MG Capsule Delayed Release 1 capsule Orally Once a day    ---    * Tramadol HCl 50 MG Tablet (Schedule IV Drug) TK 1 T PO Q 8 H PRN P 4-6 Oral     ---    * Tylenol     ---    * Vitamin D 1000 UNIT Tablet 1 tablet Orally Once a day    ---    * Medication List reviewed and reconciled with the patient    ---      Past Medical History    ---       Rheumatoid arthritis.        ---       **Surgical History**    ---       Left shoulder    ---    Gallbladder    ---      **Social History**    ---    Tobacco history: Never smoked.      **Allergies**    ---       N.K.D.A.    ---    Forrestine Him Verified]       **Hospitalization/Major Diagnostic Procedure**    ---       Denies Past Hospitalization    ---         **Reason for Appointment**    ---       1\. Low back pain and right leg pain    ---       **History of Present Illness**    ---     _NEUROSURGERY_ :    65 year  old male presents in followup with lower back pain and right leg pain  for the past 9 months. The pain is located in the right buttocks (mostly  buttocks), skips the thigh and then goes into the lateral calf into the  plantar surface of the foot to the plantar surface of his right great toe.    The patient reports that he currently doing a HEP for his right foot at this  time.    He then developed low back pain and right leg pain and has been going to  physical therapy for that. He was initially diagnosed with piriformis syndrome  until MRI demonstrated the spondylolisthesis, spinal stenosis, and synovial  cyst compressing the right L5 nerve root. He has history of rheumatoid  arthritis and was started on Enbrel 2-3 months ago. He also takes prednisone.    He has had PT for six months for his low back without relief. He cannot  tolerate NSAIDS due to his being on prednisone for his rheumatoid arthritis.  He underwent lumbar L5-S1 epidural steroid injection on 12/22/17 by Dr.  Reece Leader. The patient reports that after this injection his right leg  symptoms were improved for 2+ weeks.    Today the patient reports that his right leg pain continues to be slightly  improved, however he continues to have lower back pain. His life is still very  limited by his symptoms; he lives in a 2 story house and sleeps in his  recliner 2+ nights/week due severe pain with ambulating stairs. He is here  today to discuss surgery.    The patient had torn tendons on his left ankle; he is currently wearing an  AFO. He wears this when he is out of the house. He was told that he may need a  left ankle fusion.     _Associated Providers_ :    Primary Care Provider Verda Cumins, MD .      **Vital Signs**    ---    Pain scale 3, Ht-in 68.0, Wt-lbs 195, BMI 29.65.       **Physical Examination**    ---     _NEUROSURGERY_ :    MOTOR Lower Extremities : 5/5 strength in the bilateral lower extremities  except:, Right EHL 4/5. Reflexes Left Knee  Jerk 2+, Right Knee Jerk 2+, Left  Ankle Jerk 2+, Right Ankle Jerk 2+. Gait : previously used a cane (stopped  using since his last appointment). Babinski : Negative. Ankle Clonus :  Negative.    _DIAGNOSTIC STUDIES_ :    RADIOLOGY I reviewed lumbar spine MRI completed 09/28/17 at [name].I reviewed lumbar spine xrays that were completed on 11/17/17 at [facility]. Marland Kitchen    AFO LLE.       **Assessments**    ---    1\. Lumbar radiculopathy - M54.16 (Primary)    ---    2\. Spondylolisthesis, lumbar region - M43.16    ---      Reviewing his lumbar spine MRI at L5-S1 there is spondylolisthesis with a  stress fracture. There are degenerative changes at multiple levels in the  lumbar spine (most likely related to RA). The patient has not had lasting  improvement in his symptoms with lumbar PT and injections. He would like to  pursue surgery at this time.    I offere option of a L5-S1 TLIF. This surgery was discussed with the patient  and his wife. Surgical consent signed. His daugther is getting married at the  end of September 2019 and he would like surgery after this date.The patient  will need a pre-op surgical planning lumbar CT prior to surgery.    ---       **Follow Up**    ---  schedule surgery    Electronically signed by [name] , MD on 02/09/2018 at 03:44 PM EDT    Sign off status: Completed        * * *        Neurosurgery    [address] [place], [address]    [place], [place] [address]    Tel: [phone]    Fax: [phone]              * * *          Patient: [name] [name], [name] [name] DOB: 1955-10-02 Progress Note: [name], MD  02/09/2018    ---    Note generated by eClinicalWorks EMR/PM Software ([email or web address])

## 2020-08-26 NOTE — Progress Notes (Signed)
* * *        **  [name] [name]**    --- ---    52 Y old Male, DOB: May 04, 1956    [address] [place], [place] [address]    Home: [phone]    Provider: Lorna Few        * * *    Telephone Encounter    ---    Answered by   Vladimir Faster  Date: 01/18/2018         Time: 02:02 PM    Caller   pt    --- ---            Reason   inj appt            Message                      marva never got back to pt about injectinon appt. I do not know what information to give the pt. can you please call him back. CB# [phone].                 Action Taken                      Deloney,Marva  01/18/2018 3:02:38 PM > I spoke to the patient and explained that the injection he is seeking to get requires authorization.  As soon as we get it authorized, he will get a call for an appt.      Anastas,Mary  01/18/2018 3:08:02 PM > Thank you Marva.                    * * *                ---          * * *          Patient: [name] [name] DOB: Dec 27, 1955 Provider: Lorna Few  01/18/2018    ---    Note generated by eClinicalWorks EMR/PM Software ([email or web address])

## 2020-08-26 NOTE — Progress Notes (Signed)
* * *        [name] [name]    --- ---    65 Y old Male, DOB: July 16, 1955, External MRN: [id number]    Account Number: [id number]    [address], [place], [place]    Home: [phone]    Insurance: NHP OUT IPA    PCP: [name] Cumins, MD Referring: [name] Cumins, MD    Appointment Facility: [facility]        * * *    12/08/2017  Progress Notes: [name] [name]. [name] Few, MD **CHN#:** [id number]    --- ---    ---         **Reason for Appointment**    ---       1\. Spondylolisthesis of lumbosacral region    ---       **History of Present Illness**    ---     __ :    8 65 yo gentleman presents in initial consultation with lower back pain  and right leg pain for the past six months. The pain is located mostly in the  right buttocks , skips the thigh and then goes into the lateral calf into the  plantar surface of the foot to the plantar surface of his right great toe. He  actually tore two tendons in the left ankle getting up from a rowing machine  and was undergoing physical therapy for that. He wore a boot and his gait was  altered. He then developed low back pain and right leg pain and has been going  to physical therapy for that. He was initially diagnosed with piriformis  syndrome until MRI L-spine demonstrated the spondylolisthesis, spinal  stenosis, and synovial cyst compressing the right L5 nerve root    He did some PT with no improvement    He was given gabapentin by PCP for sleep at night but only took it [name] times (  just filled Rx) and not constant.       **Current Medications**    ---    Taking     * Aspirin 81 MG Tablet Chewable 1 tablet Orally Once a day    ---    * Calcium 600 MG Tablet 1 tablet Orally Twice a day    ---    * Enbrel 50 MG/ML Solution 1 ml Subcutaneous once a week on Friday    ---    * Fish Oil 1200 MG Capsule 1 capsule Orally Once a day    ---    * Gabapentin 300 MG Capsule Oral , Notes: takes only when he cannot sleept    ---    * Hydroxychloroquine Sulfate 200  MG Tablet TK 1 T PO Oral Once a day    ---    * PredniSONE 1 MG Tablet 4 tablets Orally Once a day    ---    * Prilosec 20 MG Capsule Delayed Release 1 capsule Orally Once a day    ---    * Tylenol     ---    * Vitamin D 1000 UNIT Tablet 1 tablet Orally Once a day    ---    Not-Taking/PRN    * Tramadol HCl 50 MG Tablet (Schedule IV Drug) TK 1 T PO Q 8 H PRN P 4-6 Oral     ---    * Medication List reviewed and reconciled with the patient    ---       **Past Medical History**    ---  Rheumatoid arthritis.        ---       **Surgical History**    ---       Left shoulder    ---    Gallbladder    ---      **Family History**    ---       No Family History documented.    ---       **Social History**    ---    Tobacco  history: Never smoked.      **Allergies**    ---       N.K.D.A.    ---       **Hospitalization/Major Diagnostic Procedure**    ---       No Hospitalization History.    ---       **Review of Systems**    ---     _Pain Management_ :    Constitutional: Denies fever, chills. Cardiac: Denies chest pain. Pulmonary:  Denies shortness of breath. GI: Denies bowel dysfunction. GU: Denies bladder  dysfunction. Hematologic: Denies anticoagulant therapy. Neurologic: Denies  numbness or weakness.          **Vital Signs**    ---    Pain scale 6, Wt-lbs 191.2, BP 142/82, HR 57, Wt-kg 86.73, Wt Change -8.8 lb,  Temp 98.1, Oxygen sat % 95.       **Physical Examination**    ---     _Pain Management_ :    Mental Status: Speech, language, affect and cognition are normal. Motor: There  is normal muscle tone. Strength is 5/5 in the deltoids, biceps,  brachioradialis, triceps, grip strength, intrinsic hand muscles, iliopsoas,  quadriceps, anterior tibialis, EHL, foot everters and gastrocnemius. Sensory:  Sensation is intact to light touch in all sensory dermatomes in upper and  lower extremities. Reflexes: The biceps, triceps, knee reflexes are 2+  bilaterally and symmetric, left ankel deferred , right 0-1. Gait:  antalgic,  using a cane. Musculoskeletal Lumbar Spine: Moderate tenderness to palpation  in right lumbar paravertebral musculature. Good ROM of lumbar spine but with  pain with forward flexion, extension, lateral flexion. Positive right straight  leg raising. No pain to palpation over SI joints. No gross asymmetric muscular  atrophy noted in lower extremities. Good ROM noted in all joints in lower  extremities.          **Assessments**    ---    1\. Right lumbar radiculopathy - M54.16 (Primary)    ---       **Treatment**    ---       **1\. Right lumbar radiculopathy**    Notes: [name] 65 yo gentleman with right lumbar radiculopathy - options of  addressing his pain discussed. He is planning surgery in the fall but would  like to wait until after his daughter's wedding    Restart gabapentin ( as he has medication at home, not sure the dosage ) - at  300 mg at bedtime . Goals and expectations along with possible side effects  discussed    LESI discussed in detail and planned ( at [facility])    .    ---      **Follow Up**    ---    2 Weeks (Reason: L5S1 ESI ( right paramedian approach ) )    Electronically signed by [name] [name] , MD on 12/08/2017 at 11:57 AM EDT    Sign off status: Completed        * * *  [facility]    [id number]    [place], [place] [address]    Tel: [phone]    Fax: [phone]              * * *          Patient: [name] [name], [name] [name] DOB: [birth year] Progress Note: [name] [name]. [name] Few, MD 12/08/2017    ---    Note generated by eClinicalWorks EMR/PM Software ([email or web address])

## 2020-08-26 NOTE — Progress Notes (Signed)
* * *        **  [name] [name]**    --- ---    1 Y old Male, DOB: 1956-05-23    [address], [place], [place] [address]    Home: [phone]    Provider: Lorna Few        * * *    Telephone Encounter    ---    Answered by   Quay Burow  Date: 01/25/2018         Time: 03:50 PM    Caller   Patient    --- ---            Reason   Injection approval            Message                      Patient is calling to check if his injections have been approved yet (please view prev encounters).       CB#: [phone]                Action Taken                      Glori Bickers  01/27/2018 5:28:15 PM > Left voicemail that injection approved and will be done at [facility] on Thursday, February 16, 2018.  [place] office will call patient with the time of appointment.                    * * *                ---          * * *          Patient: [name] [name] DOB: 21-Dec-1955 Provider: Lorna Few  01/25/2018    ---    Note generated by eClinicalWorks EMR/PM Software ([email or web address])

## 2020-08-26 NOTE — Progress Notes (Signed)
****    ---    **  Patient:** [name] [name], LARMONAccount Number:** [id number] **External MRN:** [id number]   **Provider:** Sunday Shams, MD     **DOB:** 10/12/1955 **Age:** 54 Y **Sex:** Male   **Date:** 03/10/2018     **Phone:** [phone]   **CHN#:** [id number]     **Address:** [address] [place], [place]     **Pcp:** Verda Cumins, MD        * * *        **Subjective:**        ---       **Chief Complaints:**    --- ---       1\. M54.16 DOS 03/17/18. 2. Please see Clinic Notes in [facility].Marland Kitchen    --- ---      **Medical History:**        --- ---        **Objective:**        ---         **Assessment:**        ---         **Plan:**        ---         --- ---    ---    ---          **Provider:** Sunday Shams, MD    ---     **Patient:** [name] [name] **DOB:** 12/17/1955 **Date:** 03/10/2018    ---    Electronically signed by Bettey Mare on 03/10/2018 at 01:14 PM EDT    Sign off status: Completed

## 2020-08-26 NOTE — Progress Notes (Signed)
* * *        [name] [name]    --- ---    [name], DOB: Oct 09, 1955, External MRN: [id number]    Account Number: [id number]    [address] [place], [place]    Home: [phone]    Insurance: NHP OUT IPA Payer ID: PAPER    PCP: Verda Cumins, MD Referring: Verda Cumins, MD External Visit ID:  [id number]    Appointment Facility: Neurosurgery        * * *    05/04/2018  Progress Notes: [name], MD **CHN#:** [phone]    --- ---    ---         **Current Medications**    ---    Taking     * Aspirin 81 MG Tablet Chewable 1 tablet Orally Once a day    ---    * Calcium 600 MG Tablet 1 tablet Orally Twice a day    ---    * Enbrel 50 MG/ML Solution 1 ml Subcutaneous once a week on Friday    ---    * Gabapentin 300 mg Capsule 1 capsule Oral hs    ---    * Hydroxychloroquine Sulfate 200 MG Tablet TK 1 T PO Oral Once a day    ---    * PredniSONE 1 MG Tablet 4 tablets Orally Once a day, Notes: alternates 4/5 mg qod    ---    * Prilosec 20 MG Capsule Delayed Release 1 capsule Orally Once a day    ---    * Tylenol     ---    * Vitamin D 1000 UNIT Tablet 1 tablet Orally Once a day    ---    * Medication List reviewed and reconciled with the patient    ---      Past Medical History    ---       Rheumatoid arthritis.        ---       **Surgical History**    ---       Left shoulder    ---    Gallbladder    ---    L5-S1 TLIF (transforaminal lumbar interbody fusion) with bilateral screws  03/17/2018    ---       **Social History**    ---    Tobacco history: Never smoked.      **Allergies**    ---       N.K.D.A.    ---    Forrestine Him Verified]       **Hospitalization/Major Diagnostic Procedure**    ---       see surgeries    ---        **Reason for Appointment**    ---       1\. postoperative followup status post L5-S1 TLIF (transforaminal lumbar  interbody fusion) 03/17/18    ---       **History of Present Illness**    ---     _Associated Providers_ :    Primary Care Provider Verda Cumins, MD .     _NEUROSURGERY_ :    65 y.o. male presents in postoperative followup status post L5-S1 TLIF  (transforaminal lumbar interbody fusion) with bilateral screw fixation on  03/17/18. He is doing well postoperatively. 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 His preoperative right leg pain has  resolved. He resumed his Enbrel two weeks postoperatively. His  home PT ended  last Monday.       **Vital Signs**    ---    Pain scale 1, Ht-in 68.0, Wt-lbs 185, BMI 28.13, BSA 2.00, Ht-cm 172.72, Wt-kg  83.92, Wt Change -10 lb.       **Physical Examination**    ---     _NEUROSURGERY_ :    MOTOR Lower Extremities : 5/5 strength in the bilateral lower extremities.  Reflexes Left Knee Jerk 2+, Right Knee Jerk 2+, Left Ankle Jerk 2+, Right  Ankle Jerk 2+. Gait : WNL. Babinski : Negative. Ankle Clonus : Negative.    _DIAGNOSTIC STUDIES_ :    RADIOLOGY I reviewed Xray lumbar spine ap/lateral performed on 05/04/18 at  [facility].    bilateral lumbar incisions well healed without erythema.       **Assessments**    ---    1\. Spondylolisthesis, lumbar region - M43.16 (Primary)    ---      doing great, xrays look great, he will ramp up activities and f/u prn.    ---       **Follow Up**    ---    prn    Electronically signed by [name] , MD on 05/04/2018 at 02:27 PM EST    Sign off status: Completed        * * *        Neurosurgery    [address] [place], [address]    [place], [place] [address]    Tel: [phone]    Fax: [phone]              * * *          Patient: [name] [name], [name] [name] DOB: [birth year] Progress Note: [name], MD  05/04/2018    ---    Note generated by eClinicalWorks EMR/PM Software ([email or web address])

## 2020-08-26 NOTE — Progress Notes (Signed)
* * *        [name] [name]    --- ---    65 Y old Male, DOB: 25-Sep-1955, External MRN: [id number]    Account Number: [id number]    [address] [place], [place]    Home: [phone]    Insurance: NHP OUT IPA    PCP: Verda Cumins, MD Referring: Verda Cumins, MD    Appointment Facility: [facility]        * * *    01/12/2018  Progress Notes: [name] [name]. Lorna Few, MD **CHN#:** [phone]    --- ---    ---         **Reason for Appointment**    ---       1\. Right lumbar radiculopathy    ---       **History of Present Illness**    ---     _GENERAL_ :    65 yo gentleman with low back and radicular pain , underwent LESI about 3  weeks ago with complete resolution of his radicular pain ( and improvement in  the known ankle pain and swelling)    He has some low back , axial pain that seems facet mediated and I offered him  MBbs.       **Current Medications**    ---    Taking     * Aspirin 81 MG Tablet Chewable 1 tablet Orally Once a day    ---    * Calcium 600 MG Tablet 1 tablet Orally Twice a day    ---    * Enbrel 50 MG/ML Solution 1 ml Subcutaneous once a week on Friday    ---    * Fish Oil 1200 MG Capsule 1 capsule Orally Once a day    ---    * Gabapentin 300 mg Capsule 1 capsule Oral hs, Notes: takes only when he cannot sleept    ---    * Hydroxychloroquine Sulfate 200 MG Tablet TK 1 T PO Oral Once a day    ---    * PredniSONE 1 MG Tablet 4 tablets Orally Once a day, Notes: alternates 4/5 mg qod    ---    * Prilosec 20 MG Capsule Delayed Release 1 capsule Orally Once a day    ---    * Vitamin D 1000 UNIT Tablet 1 tablet Orally Once a day    ---    Not-Taking/PRN    * Tramadol HCl 50 MG Tablet (Schedule IV Drug) TK 1 T PO Q 8 H PRN P 4-6 Oral     ---    * Tylenol     ---    * Medication List reviewed and reconciled with the patient    ---       **Past Medical History**    ---       Rheumatoid arthritis.        ---       **Surgical History**    ---       Left shoulder    ---    Gallbladder     ---      **Family History**    ---       No Family History documented.    ---       **Social History**    ---    Tobacco  history: Never smoked.      **Allergies**    ---       N.K.D.A.    ---       **  Hospitalization/Major Diagnostic Procedure**    ---       No Hospitalization History.    ---       **Review of Systems**    ---     _Pain Management_ :    Constitutional: Denies fever, chills. Cardiac: Denies chest pain. Pulmonary:  Denies shortness of breath. GI: Denies bowel dysfunction. GU: Denies bladder  dysfunction. Hematologic: Denies anticoagulant therapy. Neurologic: Denies  numbness or weakness.          **Vital Signs**    ---    Pain scale 3, Ht-in 68.0, Wt-lbs 195.6, BMI 29.74, BP 122/88, HR 62, Ht-cm  172.72, Wt-kg 88.72, Wt Change 5.34 lb, Temp 97.9, Oxygen sat % 94.       **Physical Examination**    ---     _Pain Management_ :    Mental Status: Speech, language, affect and cognition are normal. Motor: There  is normal muscle tone. Strength is 5/5 in the deltoids, biceps,  brachioradialis, triceps, grip strength, intrinsic hand muscles, iliopsoas,  quadriceps, anterior tibialis, EHL, foot everters and gastrocnemius. Sensory:  Sensation is intact to light touch in all sensory dermatomes in upper and  lower extremities. Reflexes: The biceps, triceps, knee reflexes are 2+  bilaterally and symmetric, left ankel deferred , right 0-1. Gait: Gait is  normal. Heel to toe walking is normal ( not using a cane anylonger) .  Musculoskeletal Lumbar Spine: Moderate tenderness to palpation in right lumbar  paravertebral musculature. Good ROM of lumbar spine but with pain with forward  flexion, extension, lateral flexion.POsitive facet tenderness and load  bilateral L45, L5S1 . Negative right straight leg raising. No pain to  palpation over SI joints. No gross asymmetric muscular atrophy noted in lower  extremities. Good ROM noted in all joints in lower extremities.          **Assessments**    ---    1\.  Spondylolisthesis of lumbosacral region - M43.17 (Primary)    ---    2\. Right lumbar radiculopathy - M54.16    ---    3\. Low back pain - M54.5    ---    4\. Other chronic pain - G89.29    ---       **Treatment**    ---       **1\. Spondylolisthesis of lumbosacral region**    Notes: MBBs discussed in detail and planned for next week    Radicular pain resolved ( ankle pain improved also )    He is much better, went back to rowing and walking the dog.    ---      **Follow Up**    ---    1 Week (Reason: MBb bilateral L45, L5S1)    Electronically signed by Lorna Few , MD on 01/12/2018 at 11:21 AM EDT    Sign off status: Completed        * * [facility]    [place], [place] [address]    Tel: [phone]    Fax: [phone]              * * *          Patient: [name] [name] DOB: Jul 23, 1955 Progress Note: Kandice Schmelter. Lorna Few, MD 01/12/2018    ---    Note generated by eClinicalWorks EMR/PM Software ([email or web address])

## 2020-08-26 NOTE — Progress Notes (Signed)
* * *        **  [name]**    --- ---    42 Y old Male, DOB: 1956/01/03    [address], [place], [place] [address]    Home: [phone]    Provider: Lorna Few        * * *    Telephone Encounter    ---    Answered by   Lorna Few  Date: 12/08/2017         Time: 11:59 AM    Message                      Asencion Islam , could you please schedule [name] for a LESI in [facility] at the end of the month       Best phone [phone]      Thank you!        --- ---            Action Taken                      Carold Eisner,ADRIANA  12/08/2017 12:00:09 PM >       Deloney,Marva  12/08/2017 1:31:39 PM > Appt scheduled for 12/22/17 at 9:30 am - voice mail message left.                    * * *                ---          * * *          Patient: [name] DOB: 21-Jul-1955 Provider: Lorna Few  12/08/2017    ---    Note generated by eClinicalWorks EMR/PM Software ([email or web address])

## 2020-08-26 NOTE — Progress Notes (Signed)
* * *        [name] [name]    --- ---    65 Y old Male, DOB: April 25, 1956, External MRN: [id number]    Account Number: [id number]    [name], [place], [place]    Home: [phone]    Insurance: [employer] OUT [employer]    PCP: Verda Cumins, MD Referring: Verda Cumins, MD    Appointment Facility: [facility]        * * *    12/22/2017  Progress Notes: Daemian Gahm. Lorna Few, MD **CHN#:** [id number]    --- ---    ---         **Reason for Appointment**    ---       1\. MAD LESI PER DR Lulamae Skorupski    ---       **History of Present Illness**    ---     _GENERAL_ :    [name] 65 yo gentleman here today for planned LESI to address his radicular  pain .Details and expectations discussed again .He is planning surgery but not  until fall ( after his daughter's wedding ).       **Current Medications**    ---    Taking     * Aspirin 81 MG Tablet Chewable 1 tablet Orally Once a day    ---    * Calcium 600 MG Tablet 1 tablet Orally Twice a day    ---    * Enbrel 50 MG/ML Solution 1 ml Subcutaneous once a week on Friday    ---    * Fish Oil 1200 MG Capsule 1 capsule Orally Once a day    ---    * Gabapentin 300 MG Capsule Oral , Notes: takes only when he cannot sleept    ---    * Hydroxychloroquine Sulfate 200 MG Tablet TK 1 T PO Oral Once a day    ---    * PredniSONE 1 MG Tablet 4 tablets Orally Once a day    ---    * Prilosec 20 MG Capsule Delayed Release 1 capsule Orally Once a day    ---    * Tylenol     ---    * Vitamin D 1000 UNIT Tablet 1 tablet Orally Once a day    ---    Not-Taking/PRN    * Tramadol HCl 50 MG Tablet (Schedule IV Drug) TK 1 T PO Q 8 H PRN P 4-6 Oral     ---      **Vital Signs**    ---    Wt-lbs 190.26, BP 140/83, Wt-kg 86.3, Pulse sitting 56.       **Physical Examination**    ---     _Pain Management_ :    Pre-Procedure Exam: no erythema noted at injection site.          **Assessments**    ---    1\. Right lumbar radiculopathy - M54.16 (Primary)    ---       **Procedures**    ---     Salli Real  ESI_ :    LESI #1 .Details of the procedure as well as risks and benefits were discussed  with patient at length. Written informed consent was obtained. Standard  monitors were applied and the patient was monitored and stable all throughout  the procedure. With the patient in the prone position, the backwas prepped  with chlorhexidine and draped in the usual sterile fashion. The L5-S1  interspace was  identified fluoroscopically and 1% Lidocaine was infiltrated  into the skin right paramedian for local anesthesia. A 20 gauge epidural  needle was introduced and gradually advanced under intermittent fluoroscopy  guidance towards prior identified target area. When the needle was felt to be  in the ligamentym flavum loss of resistance to saline technique was introduced  . The epidural space was accessed at 6 cm and proper needle positioning was  reconfirmed with lateral flouroscopy view.A solution 2 cc of Triamcinolone (80  mg) , 1 cc 0.25% Bupivacaine and 2 cc PFNS was then injected into the epidural  space after negative aspiration. The needle was removed. The patient tolerated  the procedure well. There was no evidence of cerebrospinal fluid, paresthesia  or heme. Patient was monitored for additional 5-10 minutes, remained  hemodinamically and neurologically stable and was discharged to home with a  ride. Written and verbal instructions were given to the patient prior to  depart from the clinic..          **Procedure Codes**    ---       91478 INJ INC NEEDLE CATH PLACE,INTERLAM EPIDUR/SUBARAC,LUMB/SACRAL;W/  IMAGE GUIDE    ---    8170 PAI TRIAMCINOLONE ACET INJ 10MG  - J3301, Units: 8.00    ---      **Follow Up**    ---    3 Weeks (Reason: follow up in [facility])    Electronically signed by Lorna Few , MD on 12/22/2017 at 09:42 AM EDT    Sign off status: Completed        * * *        [name]    [address]    [address]    [place], [place] [address]    Tel: [phone]    Fax: [phone]              * * *           Patient: [name] [name], [name] [name] DOB: 09-Jul-1955 Progress Note: [name]. Lorna Few, MD 12/22/2017    ---    Note generated by eClinicalWorks EMR/PM Software ([email or web address])

## 2021-12-09 IMAGING — CT CTA CHEST
2 of 4 series · 16 of 46 positions shown, 18 images · IV contrast (APPLIED)
Comparison: None

________________________________________________________________________________________________ 
CTA CHEST, 12/09/2021 [DATE]: 
CLINICAL INDICATION: AAA. 
A search for DICOM formatted images was conducted for prior CT imaging studies 
completed at a non-affiliated media free facility.
TECHNIQUE: The chest was scanned from base of neck through the lung bases with 
100 mL of Isovue 370  injected intravenously on a high resolution low dose CT 
scanner using dose reduction techniques.  Routine MPR and MIP 3D renderings were 
reconstructed on an independent workstation with concurrent physician 
supervision. The patients eGFR was calculated to be 74.5 mL/min/1.73 m2 using 
the i-STAT device.

[Series 7: cta chest 2.0 i31s 3 · axial · 0.80mm/px · z∈[-313,-33]mm · 13 of 154 slices shown, 15 images]
[im 7/154  soft-tissue]
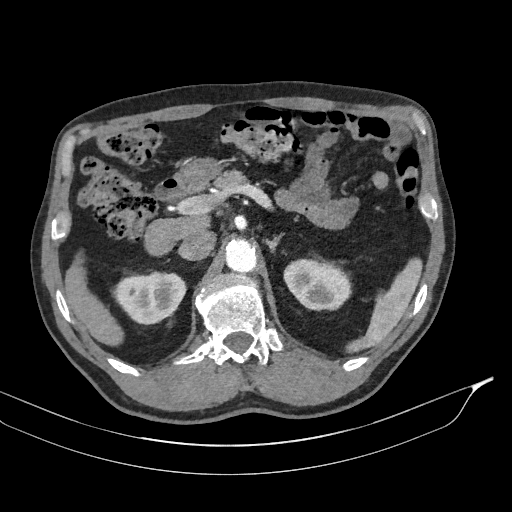
[im 7/154  bone]
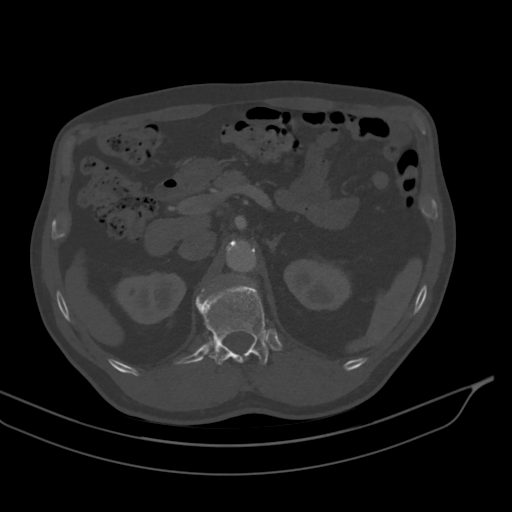
[im 20/154  soft-tissue]
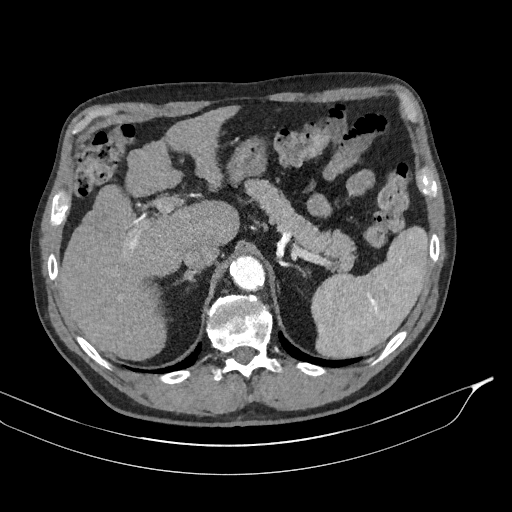
[im 32/154  soft-tissue]
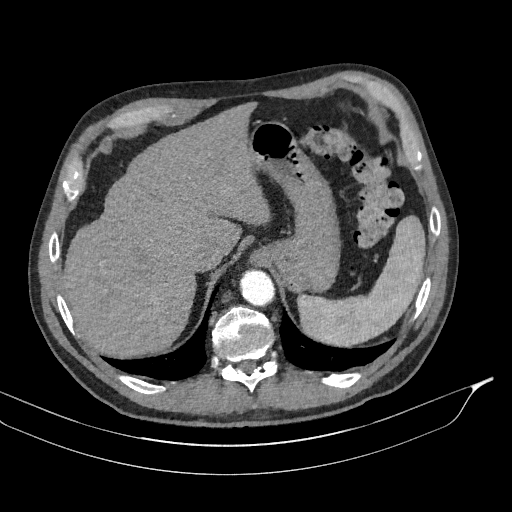
[im 45/154  soft-tissue]
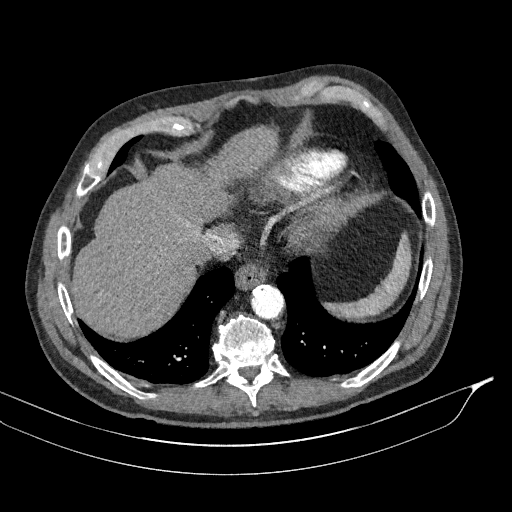
[im 52/154  soft-tissue]
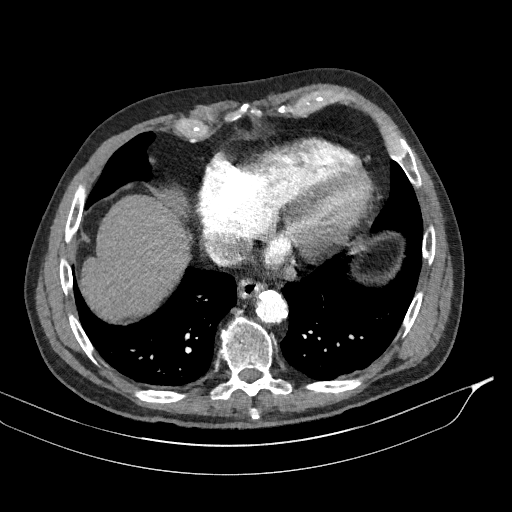
[im 64/154  soft-tissue]
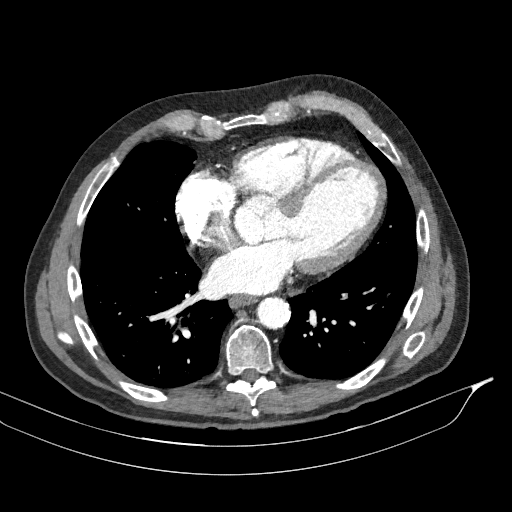
[im 77/154  soft-tissue]
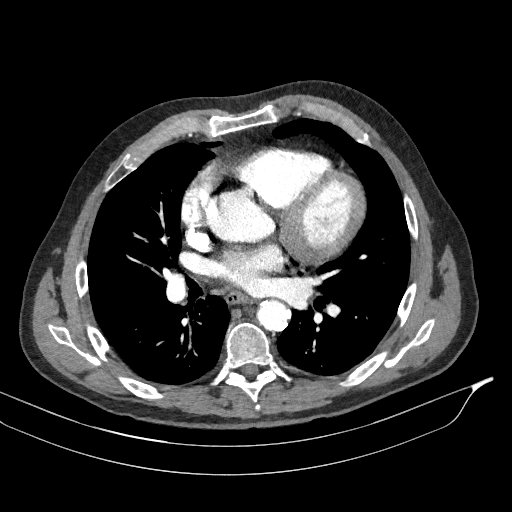
[im 90/154  soft-tissue]
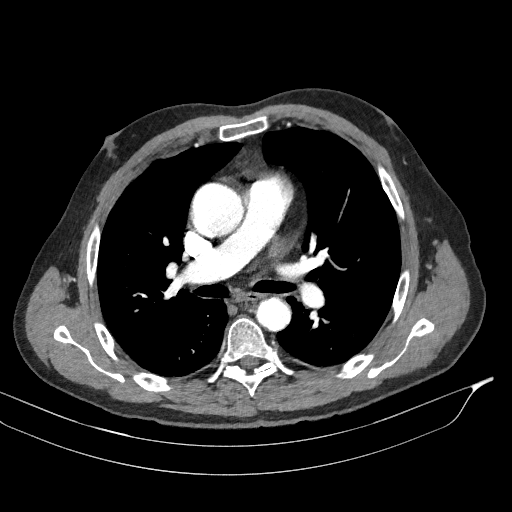
[im 103/154  soft-tissue]
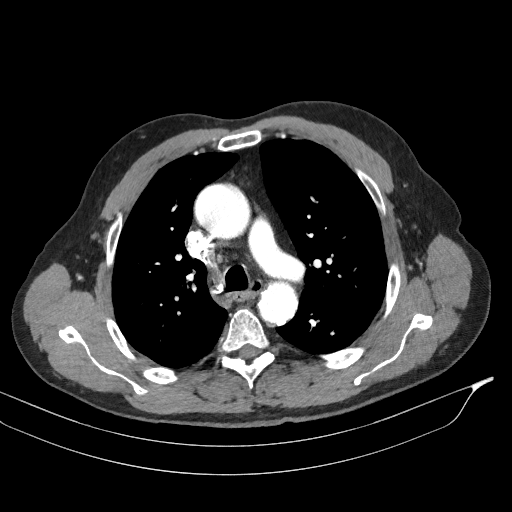
[im 103/154  bone]
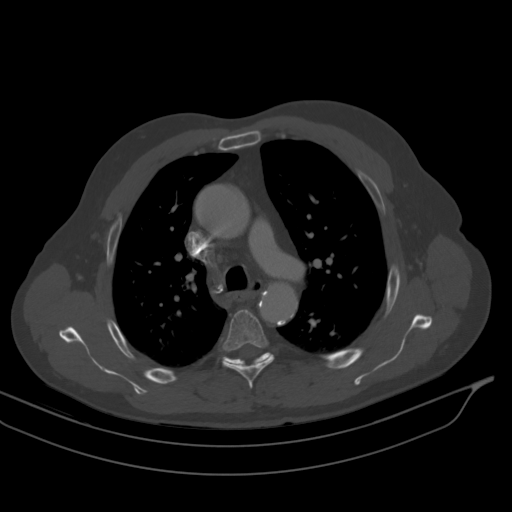
[im 109/154  soft-tissue]
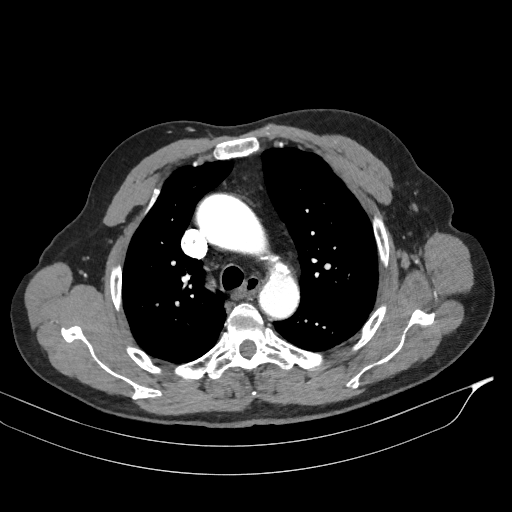
[im 122/154  soft-tissue]
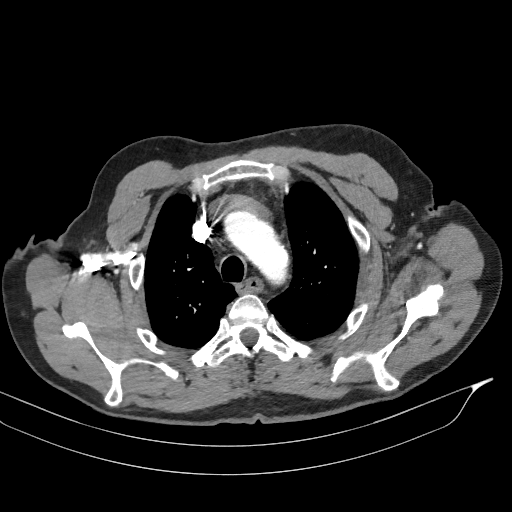
[im 134/154  soft-tissue]
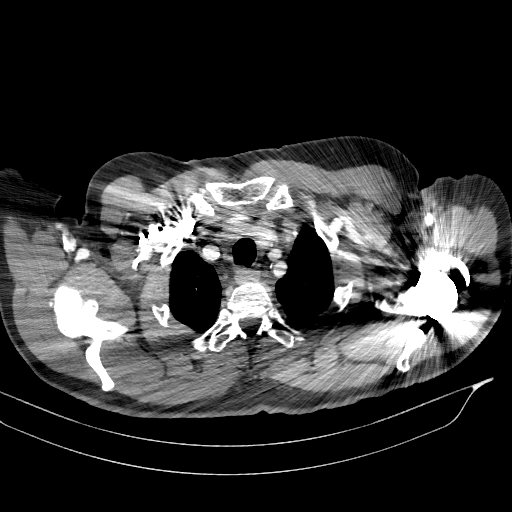
[im 147/154  soft-tissue]
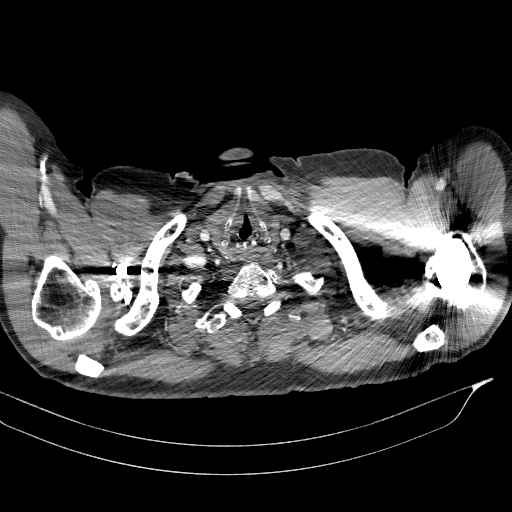

[Series 9: coronal · coronal · 0.60mm/px · 3 of 153 slices shown]
[im 51/153  soft-tissue]
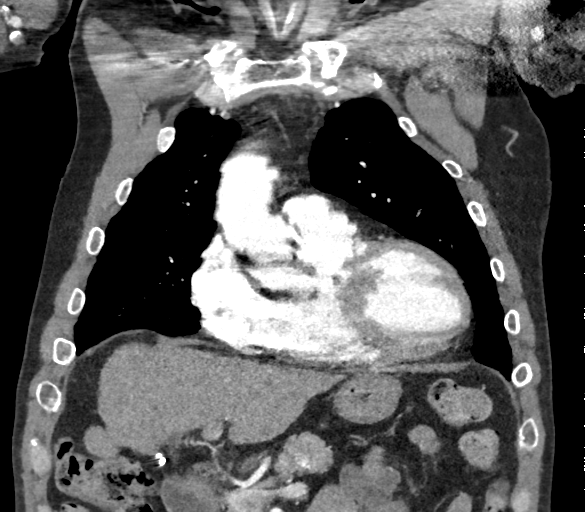
[im 68/153  soft-tissue]
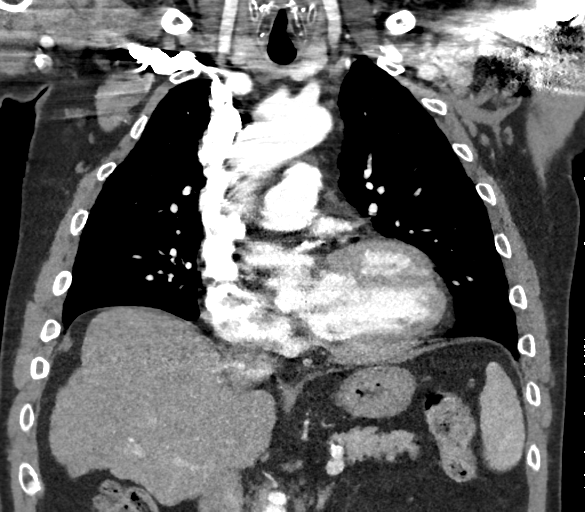
[im 85/153  soft-tissue]
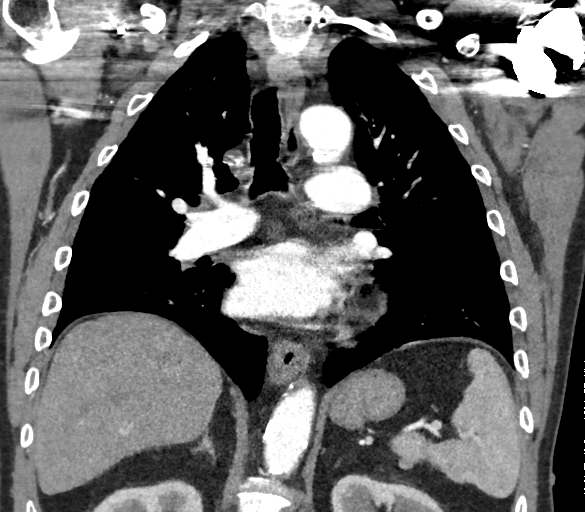

[16 of 46 positions shown; findings below may reference images not displayed]

FINDINGS: PULMONARY ARTERIES: No pulmonary embolus. 
LUNGS AND PLEURA: Mild peribronchial thickening and scattered atelectasis but no 
acute consolidation. Calcified granuloma scattered noncalcified micronodules 
largest measuring 2 mm. No acute consolidation.   No effusions.   
MEDIASTINUM: No masses.  
LYMPH NODES: No adenopathy. 
HEART: Mild prominence cardiac silhouette.  No pericardial effusion. Moderate 
coronary artery calcification. 
AORTA AND GREAT VESSELS: Ascending aorta measures 4.2 cm is greatest dimension. 
Proximal aortic arch is ectatic measuring 3.7 cm in its greatest dimension. The 
descending thoracic aorta measures 2.7 cm. 
OSSEOUS STRUCTURES: No acute fracture or destructive lesion.   
UPPER ABDOMEN: Small hiatal hernia with mild wall thickening the distal 
esophagus. Status post cholecystectomy Fatty liver. Somewhat lobulated 
configuration of the liver. Renal cysts..
IMPRESSION: Fusiform dilatation ascending aorta measuring 4.2 cm in its greatest dimension. 
No evidence of dissection. 
Ectatic aortic arch. 
No mediastinal or hilar adenopathy. 
RADIATION DOSE REDUCTION: All CT scans are performed using radiation dose 
reduction techniques, when applicable.  Technical factors are evaluated and 
adjusted to ensure appropriate moderation of exposure.  Automated dose 
management technology is applied to adjust the radiation doses to minimize 
exposure while achieving diagnostic quality images.

## 2022-11-06 IMAGING — MR MRI LEFT HIP WITHOUT CONTRAST
4 of 7 series · 14 of 40 positions shown · IV contrast (gadolinium)
Comparison: None.

________________________________________________________________________________________________ 
MRI LEFT HIP WITHOUT CONTRAST, 11/06/2022 [DATE]: 
CLINICAL INDICATION: Other instability, left hip.
TECHNIQUE: Multiplanar, multiecho position MR images of the pelvis and left hip 
were performed without intravenous gadolinium enhancement. Small field-of-view 
imaging was performed of the hip.

[Series 101: survey_fullfov_transversal · axial · 10.0mm · 1.66mm/px · 1 of 7 slices shown]
[im 1/7]
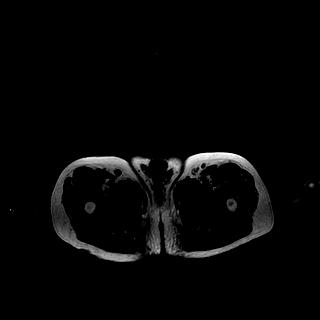

[Series 201: survey · axial · 10.0mm · 1.17mm/px · z∈[-33,+187]mm · 2 of 10 slices shown]
[im 1/10]
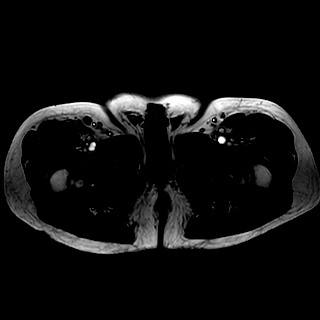
[im 10/10]
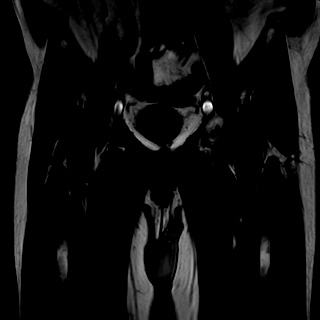

[Series 301: stir_cor-pelvis · coronal · 5.0mm · 0.66mm/px · 7 of 30 slices shown]
[im 1/30]
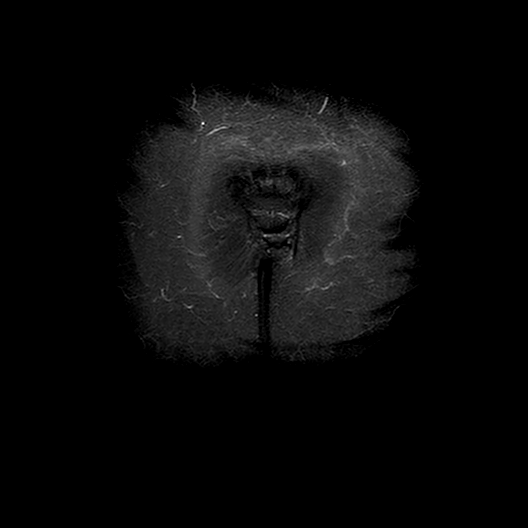
[im 5/30]
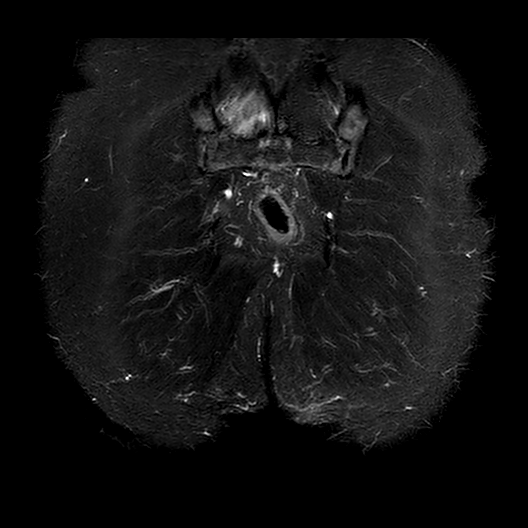
[im 10/30]
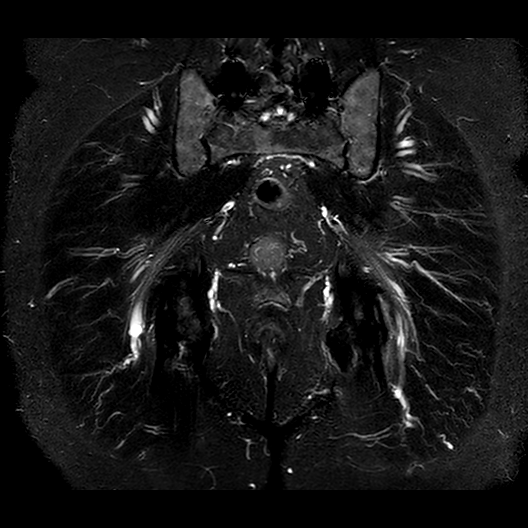
[im 15/30]
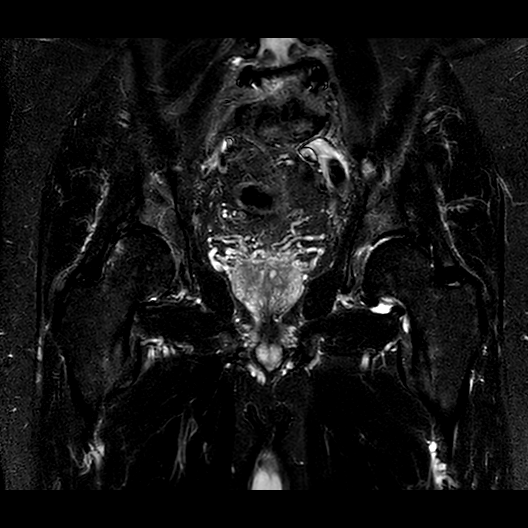
[im 20/30]
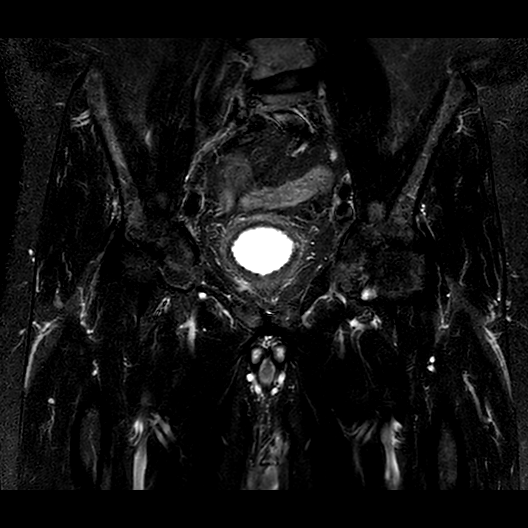
[im 25/30]
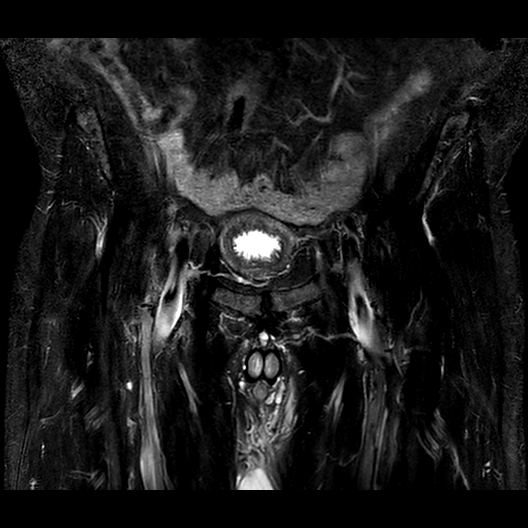
[im 30/30]
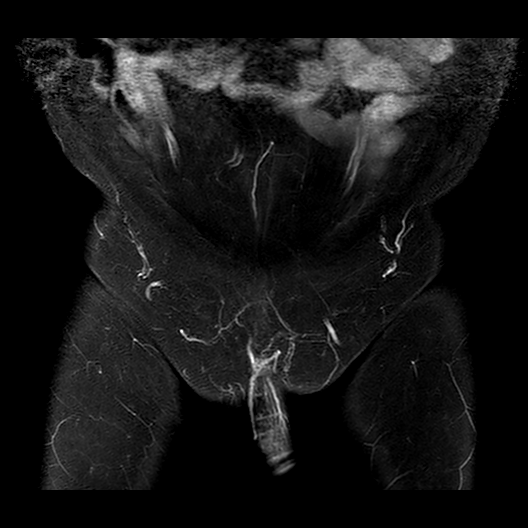

[Series 401: t1_(person_name) · axial · 5.0mm · 0.44mm/px · z∈[-57,+147]mm · 4 of 40 slices shown]
[im 1/40]
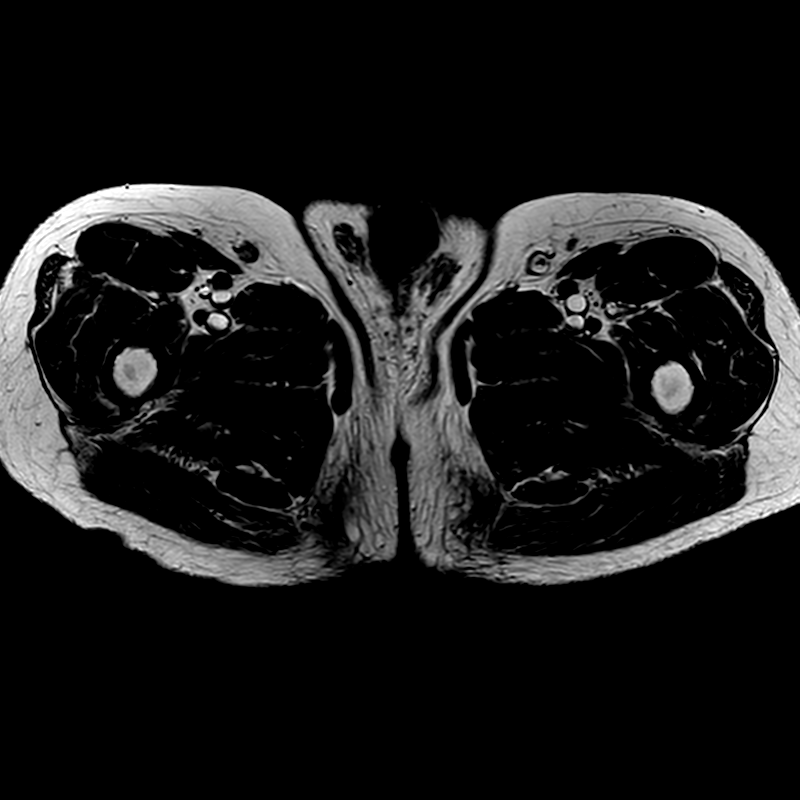
[im 5/40]
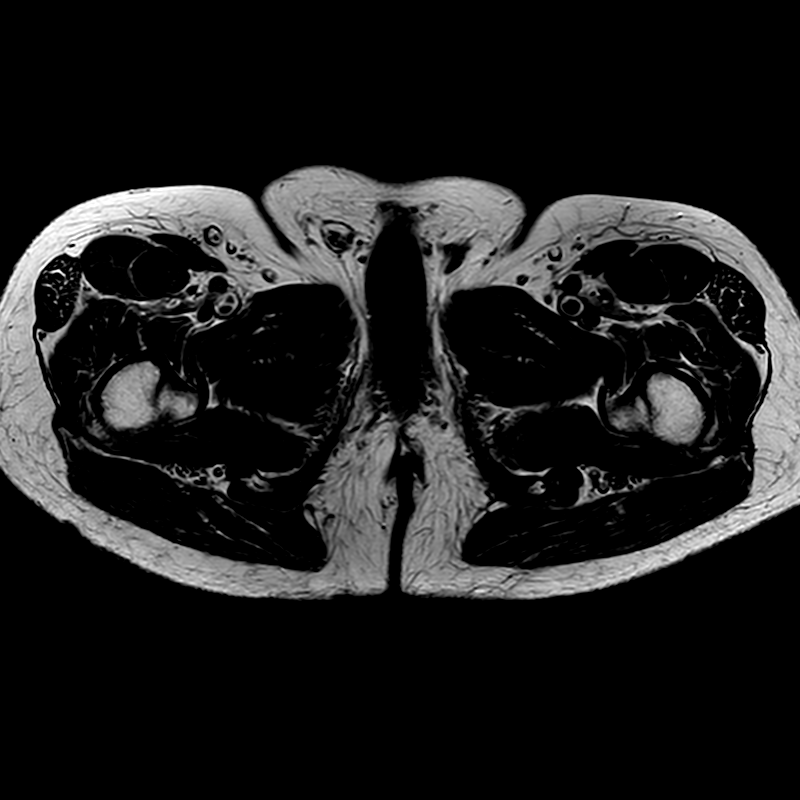
[im 22/40]
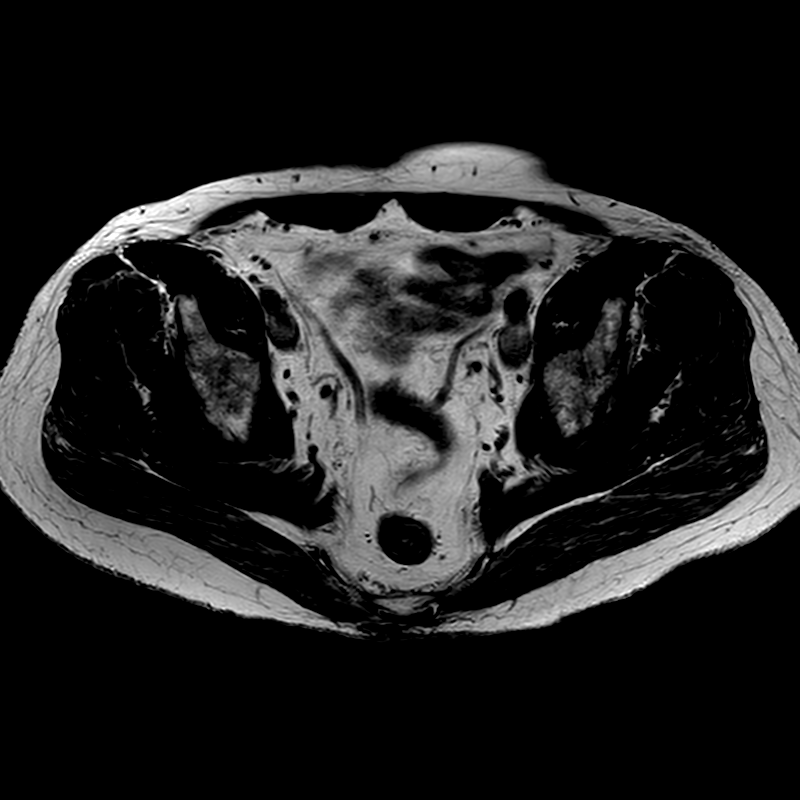
[im 35/40]
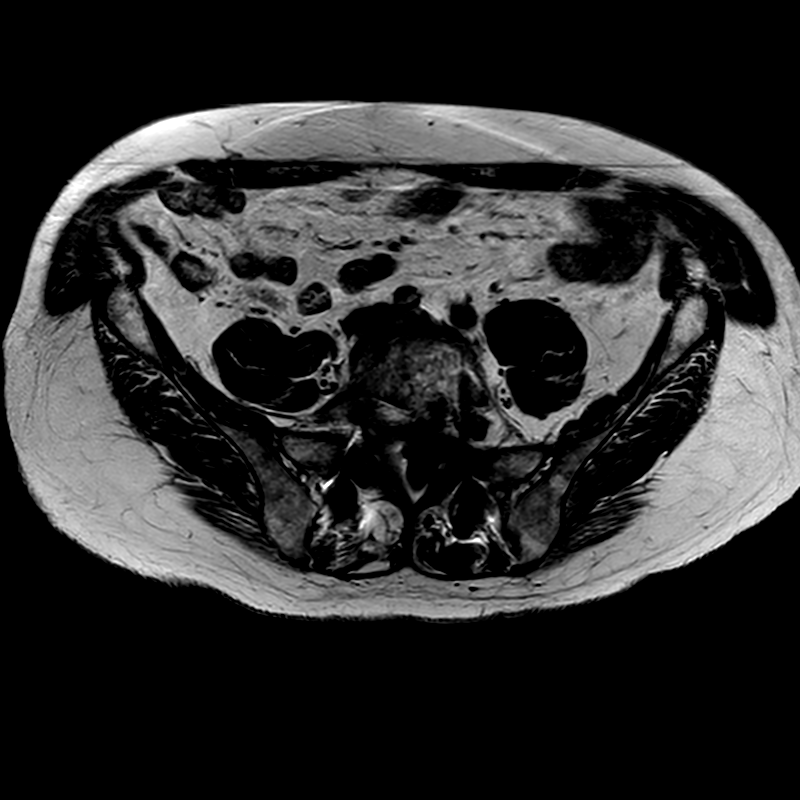

[14 of 40 positions shown; findings below may reference images not displayed]

FINDINGS: HIPS: Moderate degenerative change of the hips with partial-thickness and 
full-thickness chondromalacia, small acetabular osteophytes, degenerative labral 
tears and right lateral femoral head/neck subcortical cysts. Small left hip 
joint effusion. Both femoral heads maintain a spherical configuration without 
evidence of avascular necrosis or subarticular collapse. No abnormal morphology 
of the proximal femurs or acetabulum to predispose to impingement. 
PELVIC BONES: Normal marrow signal intensity. No fracture, contusion or marrow 
replacing lesion.  
SI JOINTS/PUBIC SYMPHYSIS: Mild degenerative change. 
SPINE: Postsurgical and degenerative change of the spine. Type I Modic changes 
at L4-5. 
SOFT TISSUES: Mild tendinosis of the bilateral distal gluteus minimus tendons 
with tendon thickening, intermediate signal and mild peritendinous edema. The 
abductor cuffs are otherwise preserved without high-grade interstitial tear. 
There is trace fluid overlying the greater trochanters without overt 
trochanteric bursitis. The origins of the hamstrings are intact. The rectus 
abdominis-adductor aponeurotic complexes are intact. No mass, free fluid or 
adenopathy. Prostate measures 4.4 x 3.1 x 3.7 cm. Partially decompressed bladder 
with wall measuring 0.8 cm. The bowel is unremarkable.
IMPRESSION: 1.  Moderate degenerative change of the hips, labral tears and small left hip 
joint effusion.  
2.  Mild degenerative change of the SI joints/pubic symphysis. 
3.  Postsurgical and degenerative change of the spine.

## 2023-05-16 IMAGING — MR MRI LUMBAR SPINE WITHOUT CONTRAST
6 of 9 series · 13 of 48 positions shown · IV contrast (gadolinium)
Comparison: None

________________________________________________________________________________________________ 
MRI LUMBAR SPINE WITHOUT CONTRAST, 05/16/2023 [DATE]: 
CLINICAL INDICATION: Radiculopathy, Lumbar Region
TECHNIQUE: Multiplanar, multiecho position MR images of the lumbar spine were 
performed without intravenous gadolinium enhancement. Patient was scanned on a 
1.5T magnet

[Series 101: survey · axial · 10.0mm · 1.25mm/px · 1 of 10 slices shown]
[im 1/10]
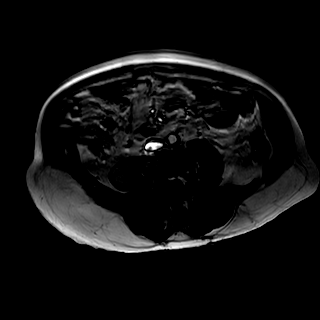

[Series 201: t2w_cor-surv · coronal · 6.0mm · 0.62mm/px · 1 of 11 slices shown]
[im 1/11]
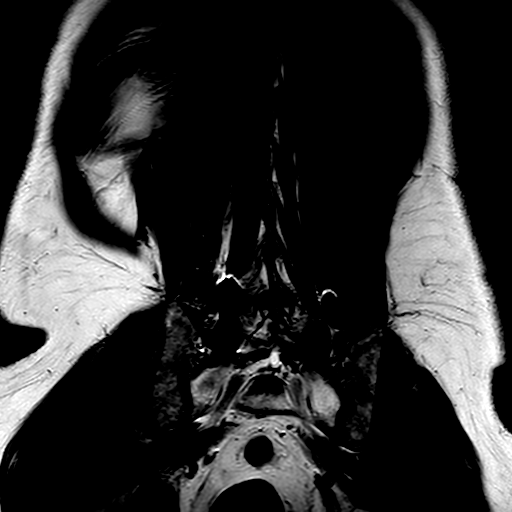

[Series 301: t1_tse_sag · sagittal · 4.0mm · 0.31mm/px · 3 of 19 slices shown]
[im 1/19]
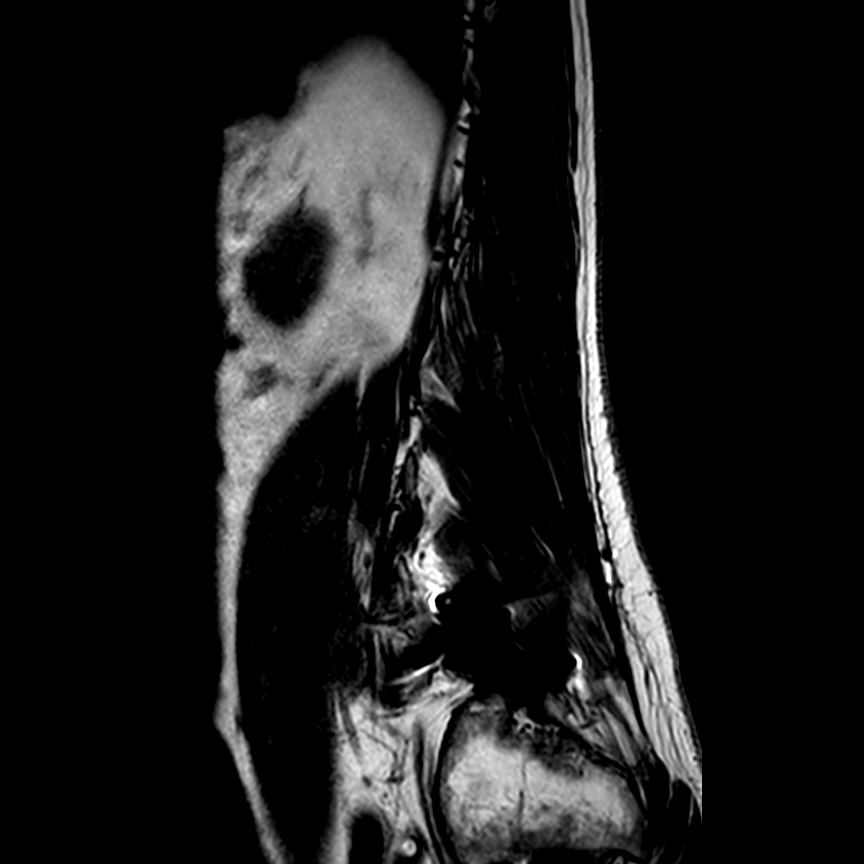
[im 10/19]
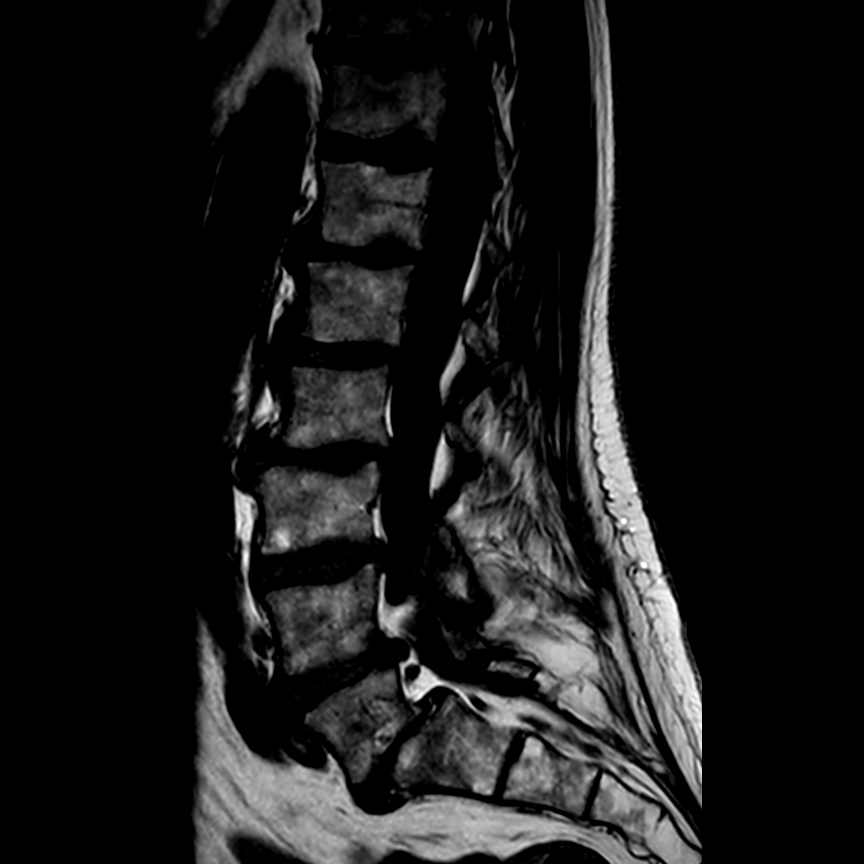
[im 19/19]
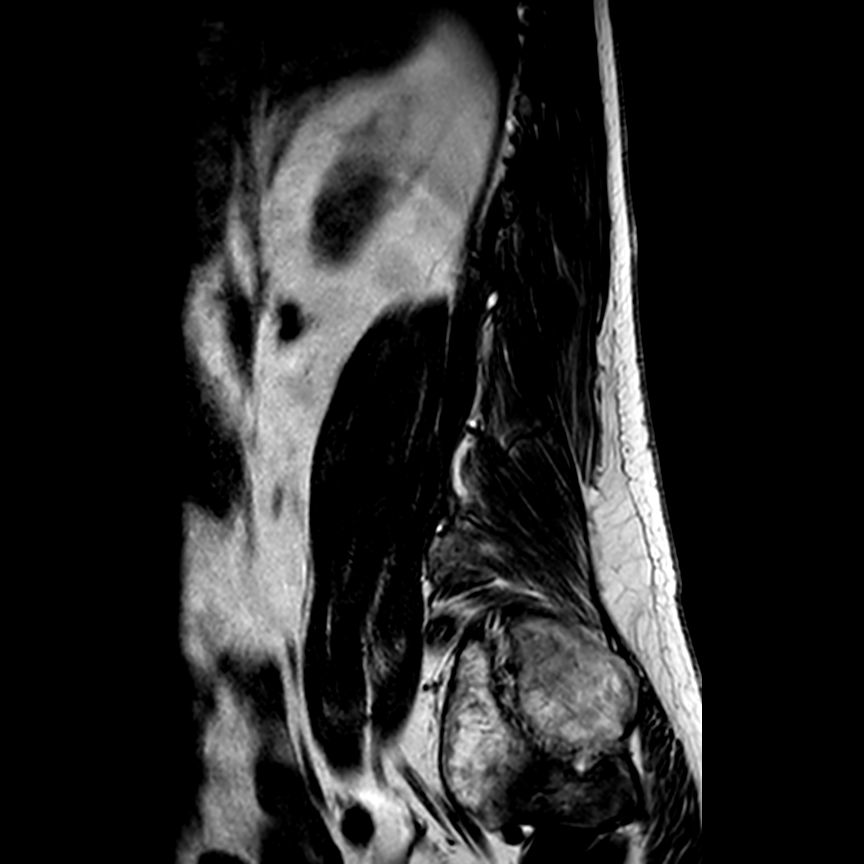

[Series 402: (id)_mdixon_tse · sagittal · 4.0mm · 0.48mm/px · 2 of 19 slices shown]
[im 1/19]
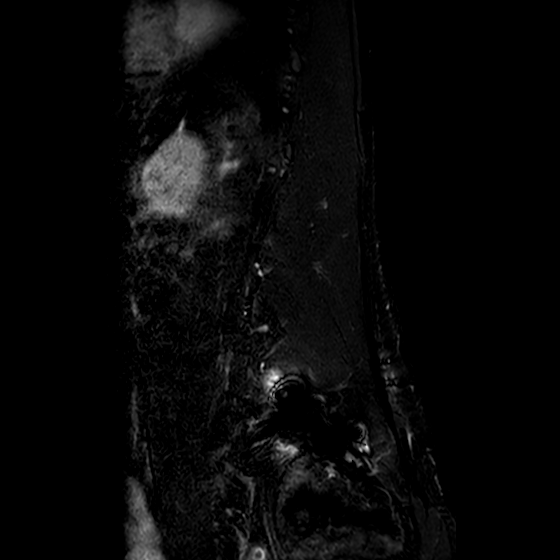
[im 10/19]
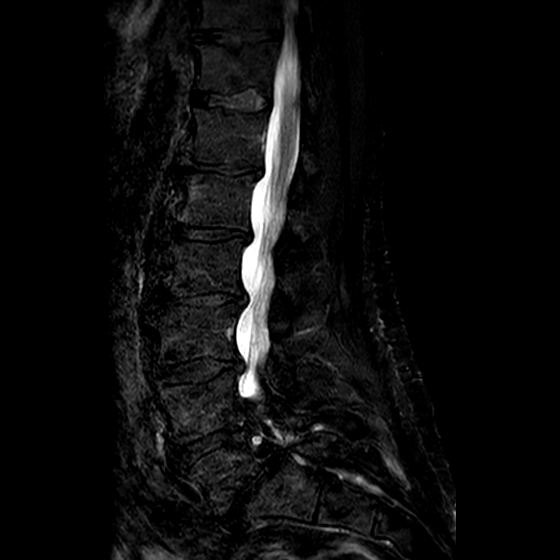

[Series 601: T2 · axial · 4.0mm · 0.30mm/px · z∈[-125,+112]mm · 4 of 30 slices shown (1 of 2)]
[im 1/30]
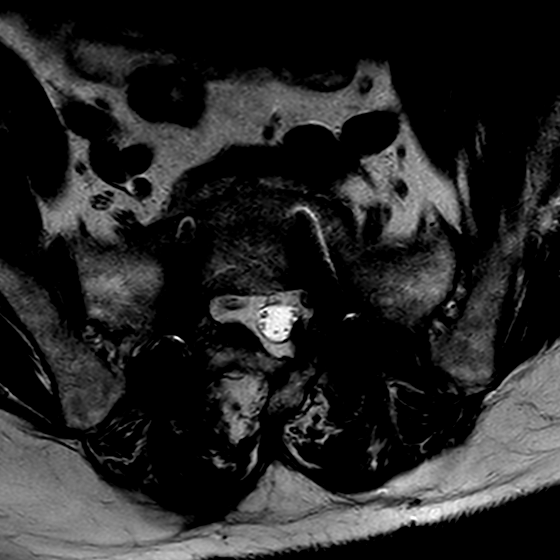
[im 10/30]
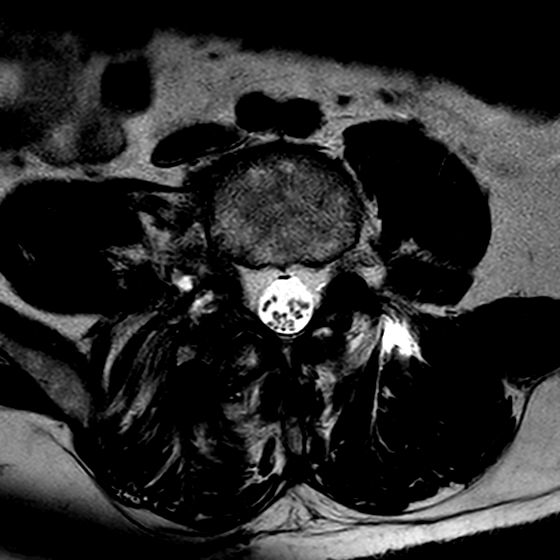
[im 20/30]
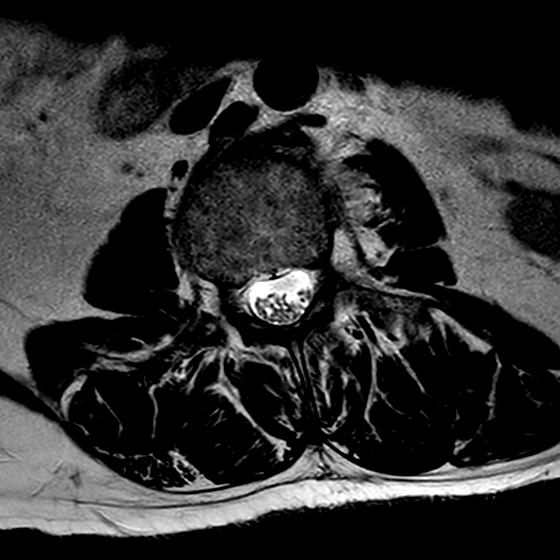
[im 30/30]
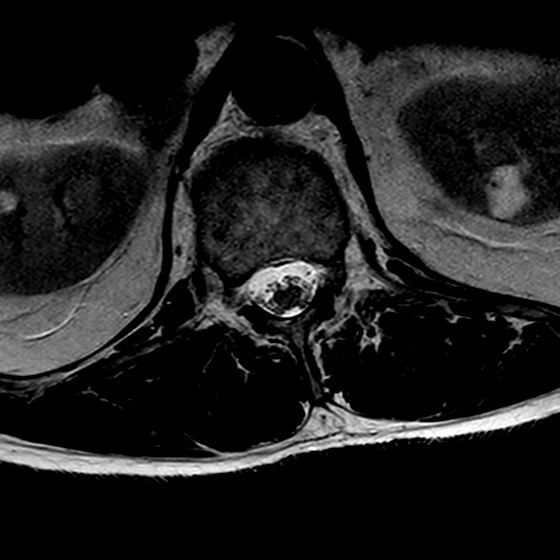

[Series 701: T2 · axial · 4.0mm · 0.30mm/px · z∈[-144,-90]mm · 2 of 16 slices shown (2 of 2)]
[im 1/16]
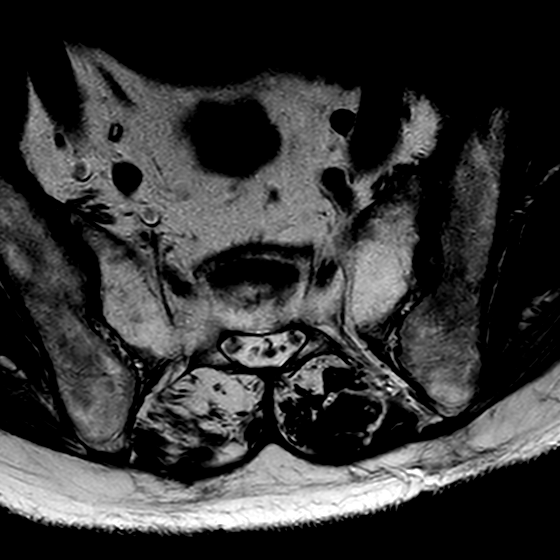
[im 16/16]
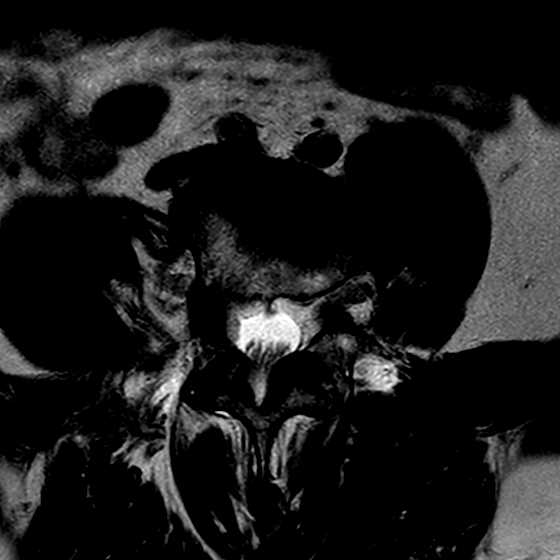

[13 of 48 positions shown; findings below may reference images not displayed]

FINDINGS: GENERAL: 
Nomenclature is based on 5 lumbar type vertebral bodies.     
ALIGNMENT: 0.7 cm anterolisthesis at L5-S1. 14 degrees dextroscoliosis. 
VERTEBRAE: L5 pars interarticularis defects. L5-S1 pedicle screw/rod fixation, 
intervertebral disc spacer and left partial facetectomy. No interbody fusion. No 
fracture. Multilevel osteophytes.  
MARROW SIGNAL: No focal suspect signal abnormality. 
CORD SIGNAL: Normal distal spinal cord and cauda equina. Conus medullaris 
terminates at L1. 
EXTRASPINAL STRUCTURES: Unremarkable. 
Modic I-II: None. 
Ligamentum Flavum > 2.5 mm: All levels 
SEGMENTAL: 
T12-L1: Disc bulge, moderate disc space narrowing and disc desiccation. No 
herniation. Mild facet arthropathy. No central canal stenosis. Mild left lateral 
recess/neural foraminal stenosis. 
L1-L2: Disc bulge, mild disc space narrowing and disc desiccation. No 
herniation. Mild facet arthropathy. No central canal stenosis. Mild left lateral 
recess. No neural foraminal stenosis. 
L2-L3: Disc bulge, moderate disc space narrowing and disc desiccation. No 
herniation. Mild facet arthropathy. Mild central canal stenosis. Moderate 
lateral recess stenosis and left neural foraminal stenosis. 
L3-L4: Disc bulge, mild disc space narrowing and disc desiccation. No 
herniation. Mild facet arthropathy. No central canal stenosis. Mild lateral 
recess stenosis and right neural foraminal stenosis. 
L4-L5: Disc bulge, mild disc space narrowing and disc desiccation. No 
herniation. Facet arthropathy. No central canal stenosis. Marked right and 
moderate left lateral recess stenosis. Mild right neural foraminal stenosis. 
L5-S1: L5 pars interarticularis defects. L5-S1 pedicle screw/rod fixation, 
intervertebral disc spacer and left partial facetectomy. No interbody fusion. 
0.7 cm anterolisthesis, uncovering of the posterior disc, marked posterior disc 
space narrowing and disc desiccation. No herniation. Marked right facet 
arthropathy. No central canal stenosis. Marked right lateral recess and moderate 
right neural foraminal stenosis, with encroachment on the right L5 nerve root.  
IMPRESSION 
1.  Multifocal degenerative change, mild dextroscoliosis, mild L2-3 central 
canal stenosis, variable multilevel lateral recess stenosis and mild multifocal 
neural foraminal stenosis. 
2.  L5 pars interarticularis defects, 0.7 cm anterolisthesis, L5-S1 fixation and 
left partial facetectomy.  
3.  L5-S1 marked right lateral recess and moderate right neural foraminal 
stenosis, with encroachment on the right L5 nerve root.

## 2023-07-21 IMAGING — DX LUMBAR SPINE AP, LAT WITH FLEXION AND EXTEN
1 series · 4 of 4 positions shown · non-contrast
Comparison: 05/16/2023

________________________________________________________________________________________________ 
LUMBAR SPINE AP, LAT WITH FLEXION AND EXTEN, 07/21/2023 [DATE]: 
CLINICAL INDICATION: Back pain. Other abnormalities of gait and mobility. 
Radiculopathy, Lumbar Region

[Series 1: lateral · 0.14mm/px · 4 of 4 slices shown]
[im 1/4]
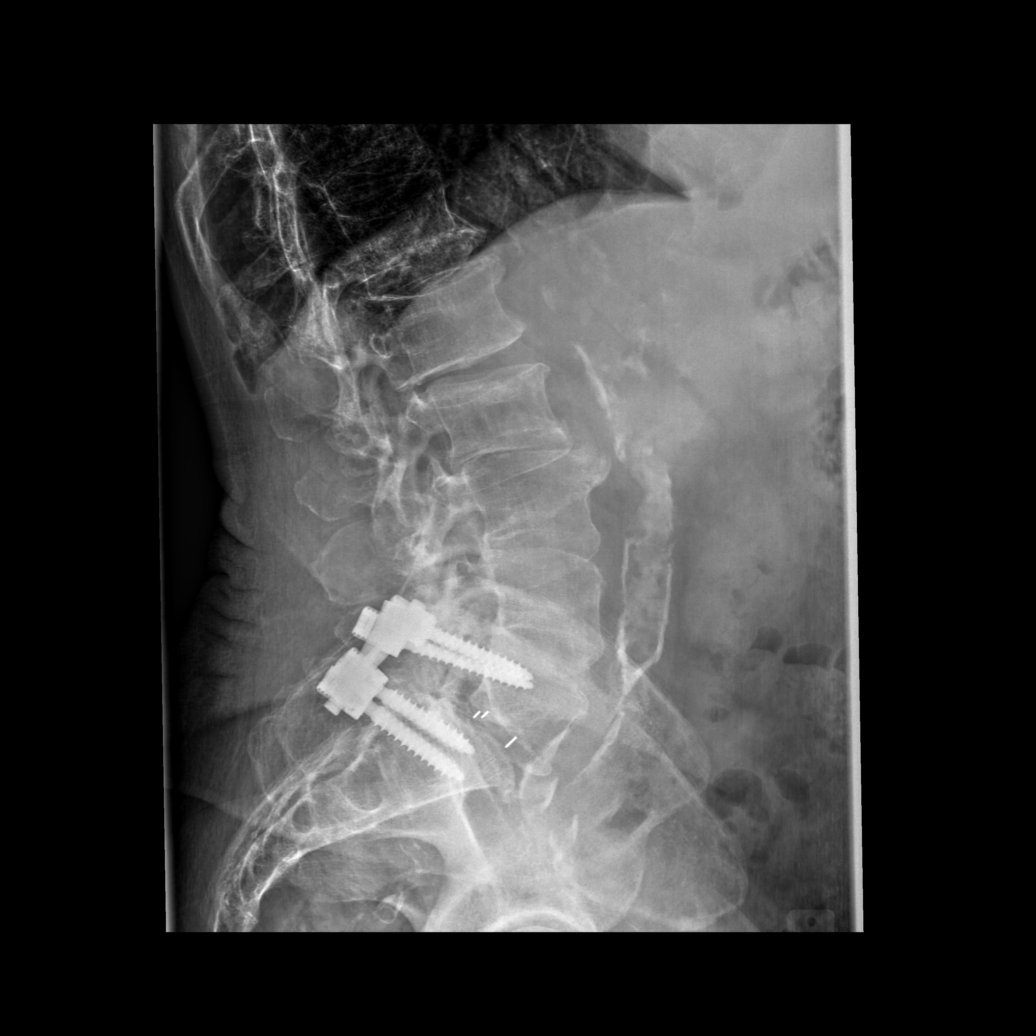
[im 2/4]
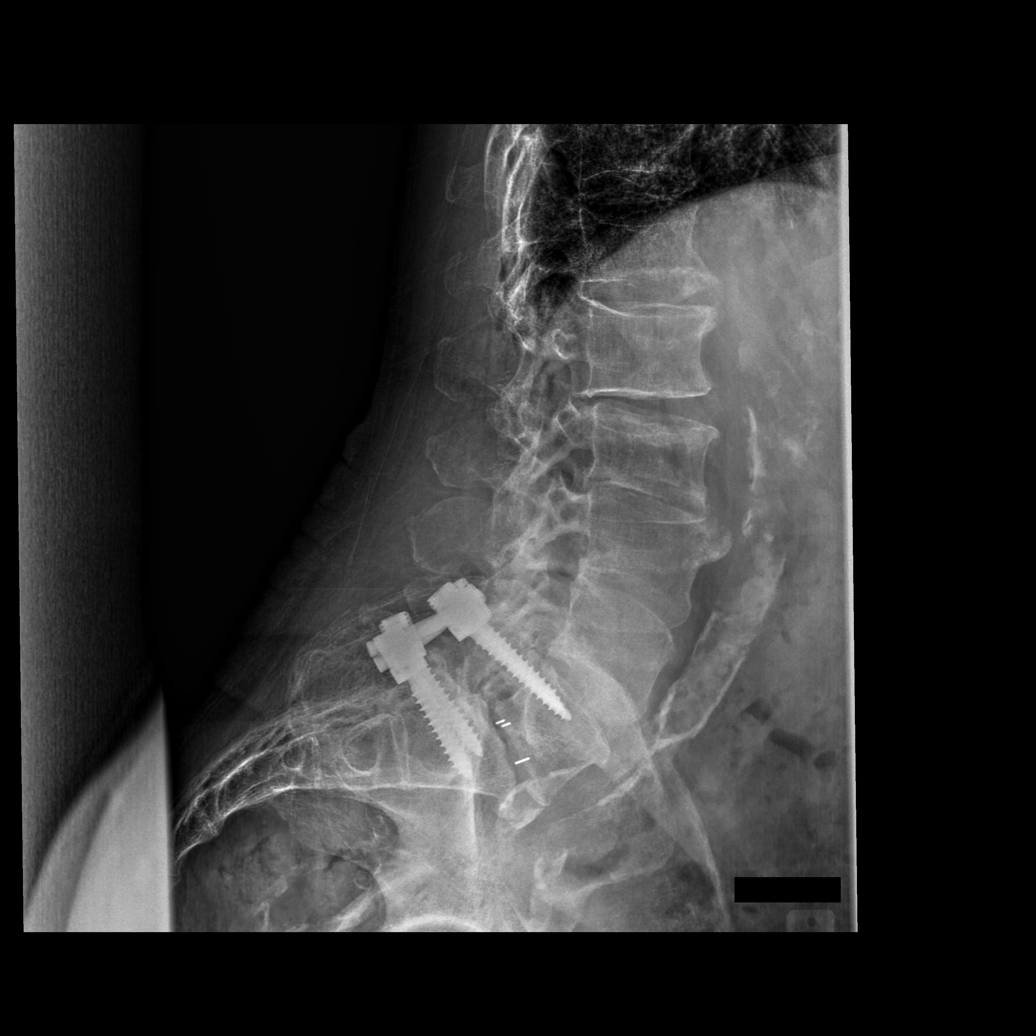
[im 3/4]
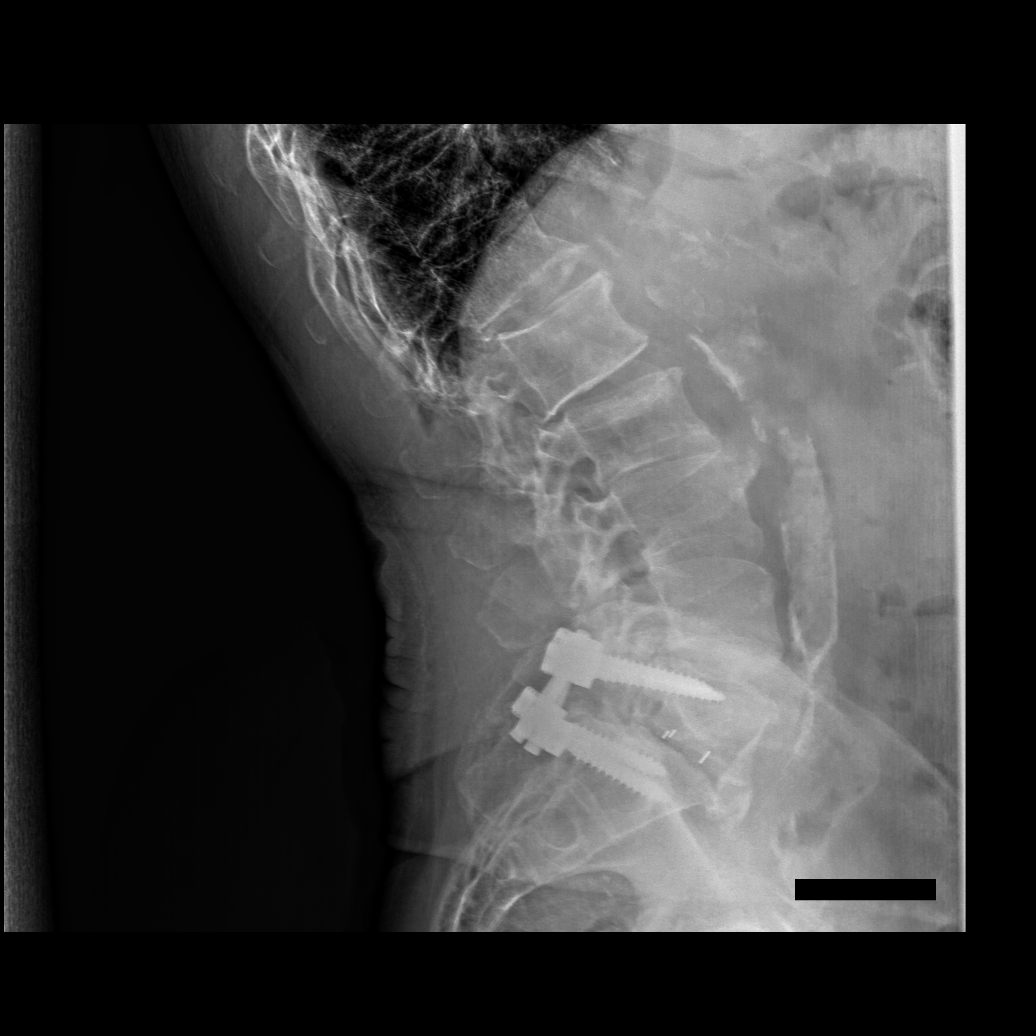
[im 4/4]
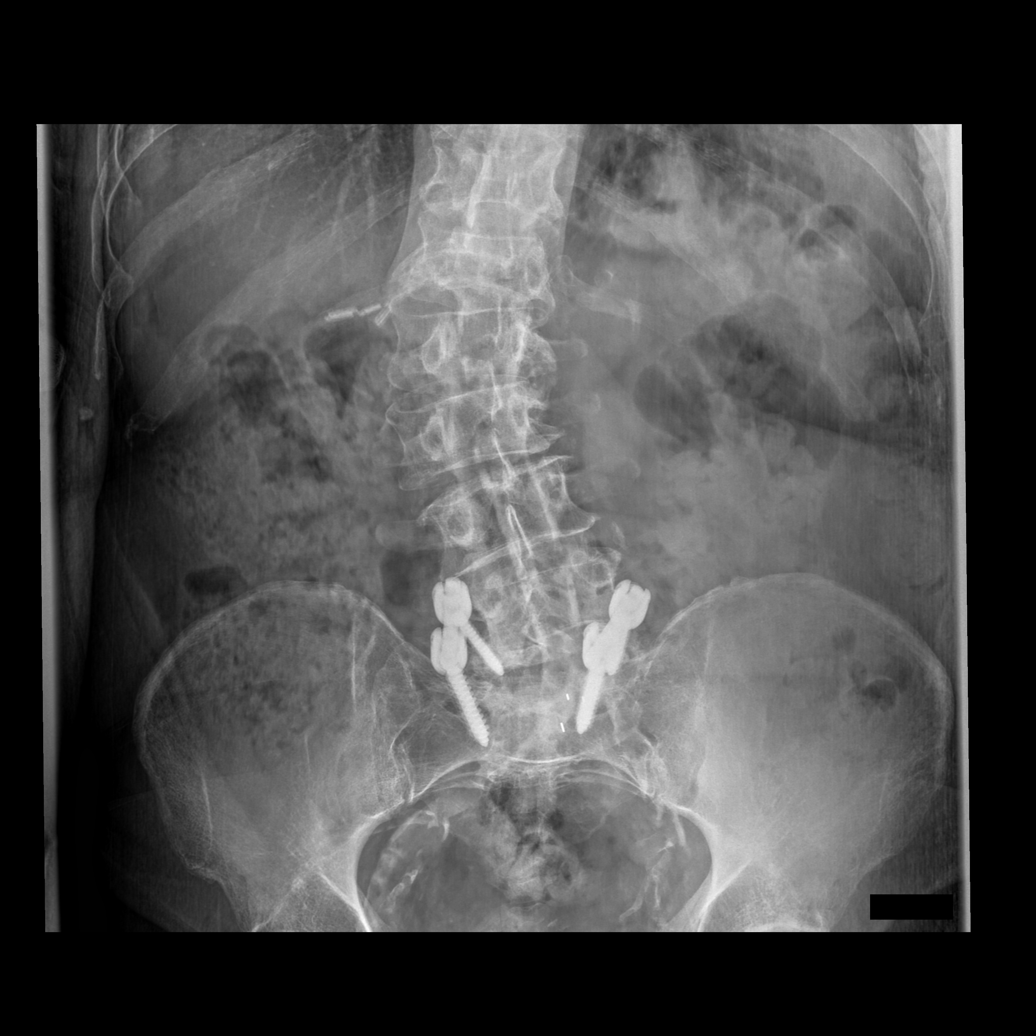

[4 of 4 positions shown; findings below may reference images not displayed]

FINDINGS: Multifocal degenerative change, 25 degrees dextroscoliosis, L5 pars 
interarticularis defects, L5-S1 grade 1 anterolisthesis and pedicle screw/rod 
fixation and intervertebral disc spacer. No interbody fusion. No dynamic 
instability. Lower lumbar/lumbosacral facet arthropathy. Degenerative change of 
the SI joints. Osteopenia. Atherosclerosis.
IMPRESSION: 1.  Multifocal degenerative change, dextroscoliosis and osteopenia. 
2.  L5 pars interarticularis defects, L5-S1 grade 1 anterolisthesis and 
fixation. No dynamic instability.

## 2023-08-06 IMAGING — MR MRI BRAIN WITHOUT CONTRAST
8 of 11 series · 25 of 48 positions shown · IV contrast (gadolinium)
Comparison: None.

________________________________________________________________________________________________ 
MRI BRAIN WITHOUT CONTRAST, 08/06/2023 [DATE]: 
CLINICAL INDICATION: Other abnormalities of gait and mobility
TECHNIQUE: Multiplanar, multiecho position MR images of the brain were performed 
without intravenous gadolinium enhancement. Patient was scanned on a
magnet.

[Series 102: mpr - smartbrain · axial · 1.1mm · 1.09mm/px · 1 of 2 slices shown]
[im 1/2]
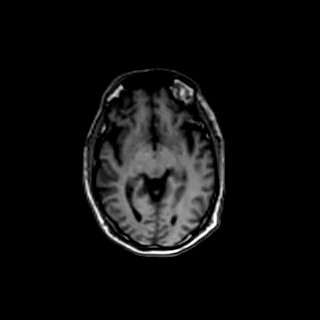

[Series 203: dadc map · axial · 5.0mm · 1.00mm/px · 1 of 24 slices shown (1 of 2)]
[im 1/24]
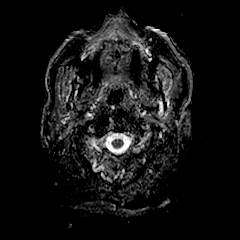

[Series 204: (id) · axial · 5.0mm · 1.00mm/px · z∈[-85,+70]mm · 3 of 27 slices shown (1 of 2)]
[im 1/27]
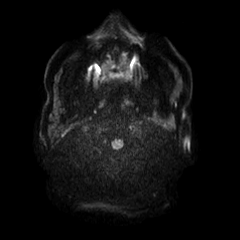
[im 14/27]
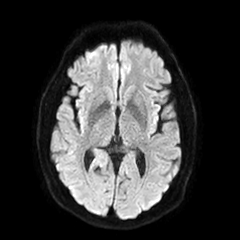
[im 27/27]
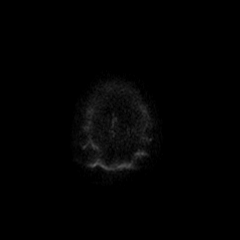

[Series 303: dadc map · coronal · 5.0mm · 0.81mm/px · 3 of 31 slices shown (2 of 2)]
[im 1/31]
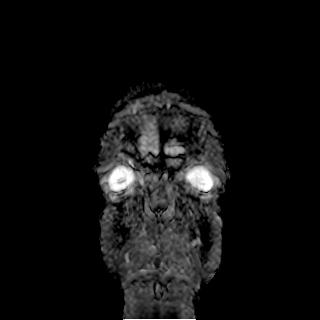
[im 16/31]
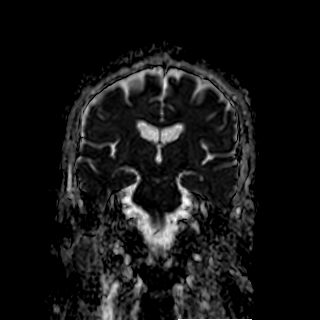
[im 31/31]
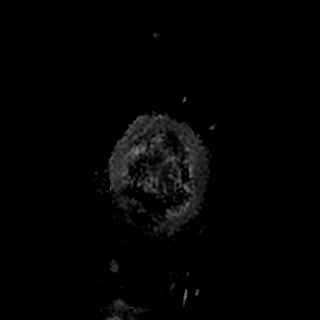

[Series 304: (id) · coronal · 5.0mm · 0.81mm/px · 3 of 31 slices shown (2 of 2)]
[im 1/31]
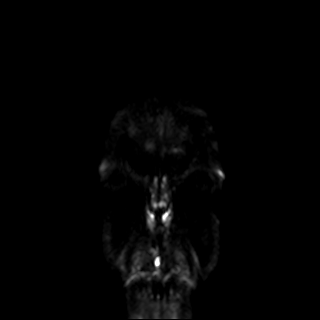
[im 16/31]
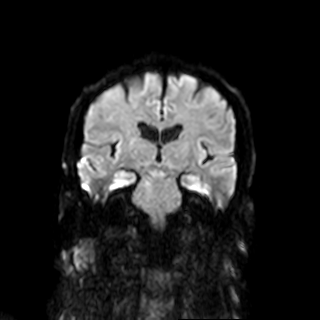
[im 31/31]
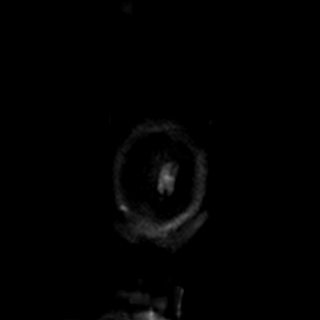

[Series 401: t1_se_sag · sagittal · 4.0mm · 0.43mm/px · 3 of 28 slices shown]
[im 1/28]
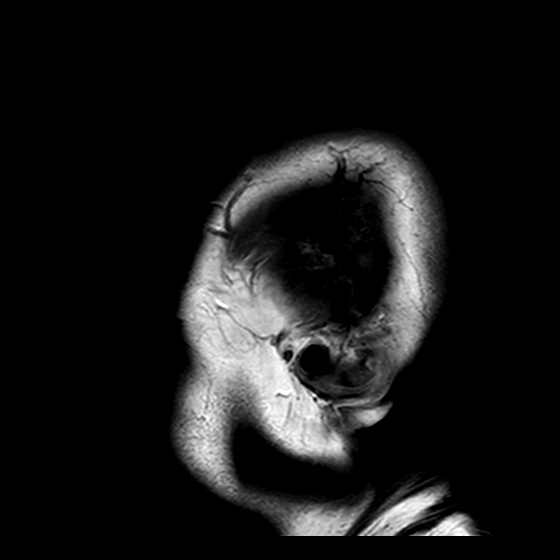
[im 14/28]
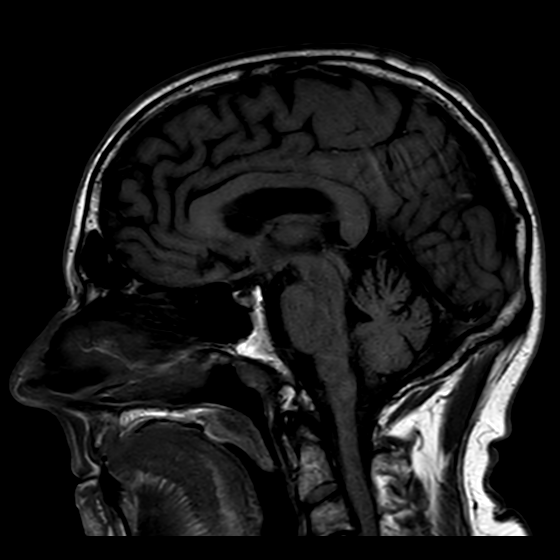
[im 28/28]
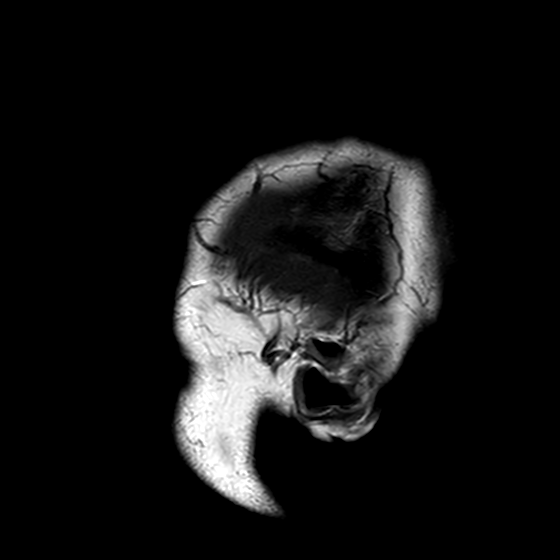

[Series 501: flair_ax+fs · axial · 5.0mm · 0.49mm/px · z∈[-84,+71]mm · 3 of 27 slices shown]
[im 1/27]
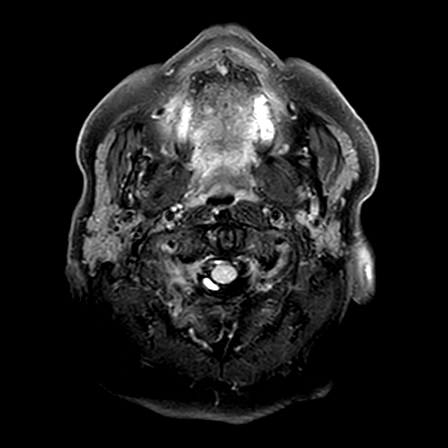
[im 14/27]
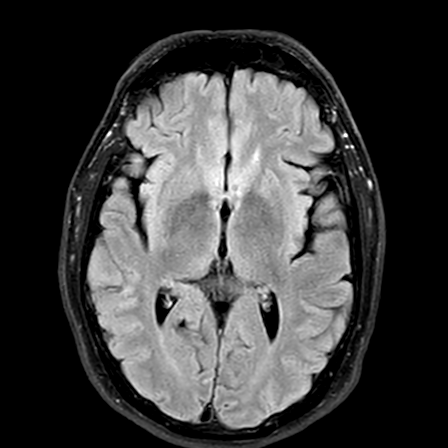
[im 27/27]
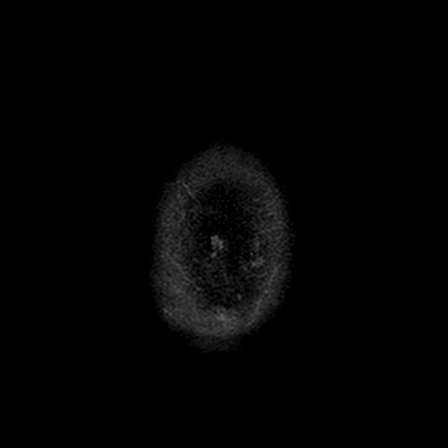

[Series 602: swip · axial · 10.0mm · 0.36mm/px · z∈[-69,+69]mm · 8 of 140 slices shown]
[im 1/140]
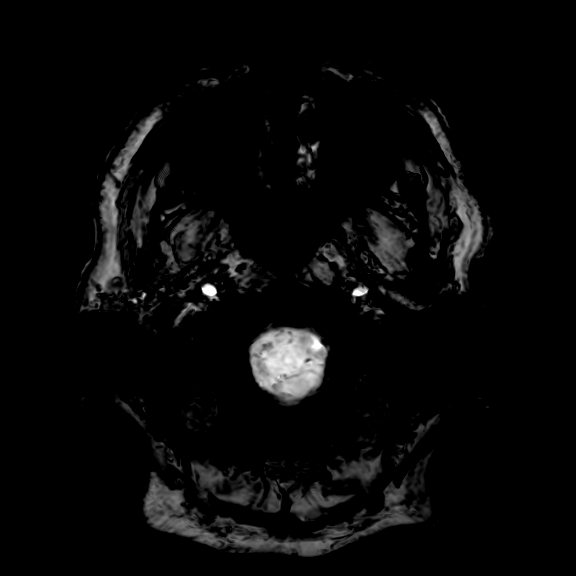
[im 22/140]
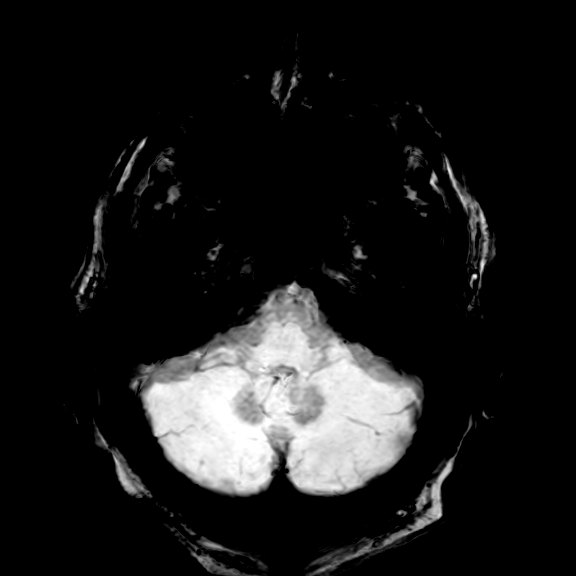
[im 43/140]
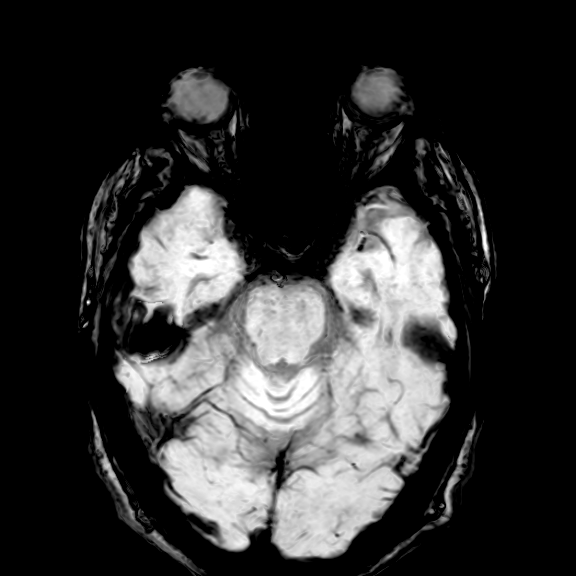
[im 65/140]
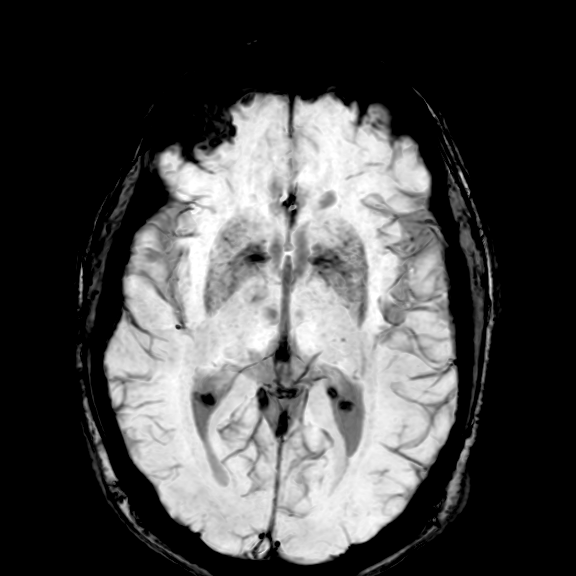
[im 75/140]
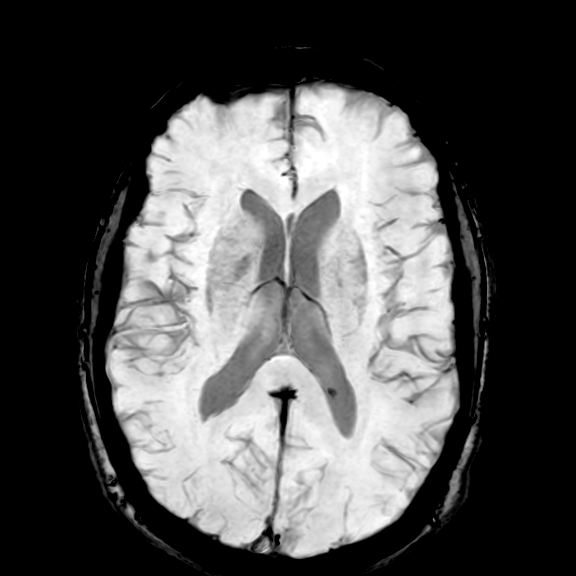
[im 97/140]
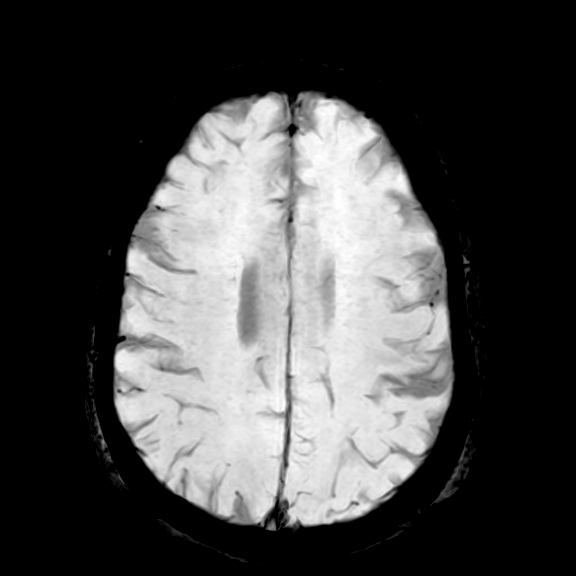
[im 118/140]
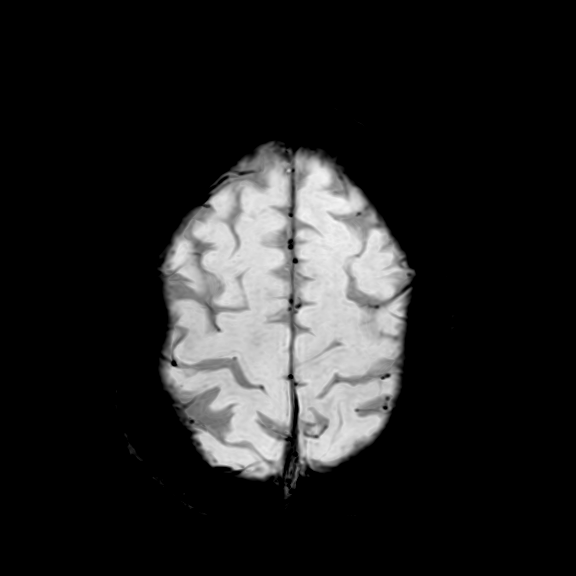
[im 140/140]
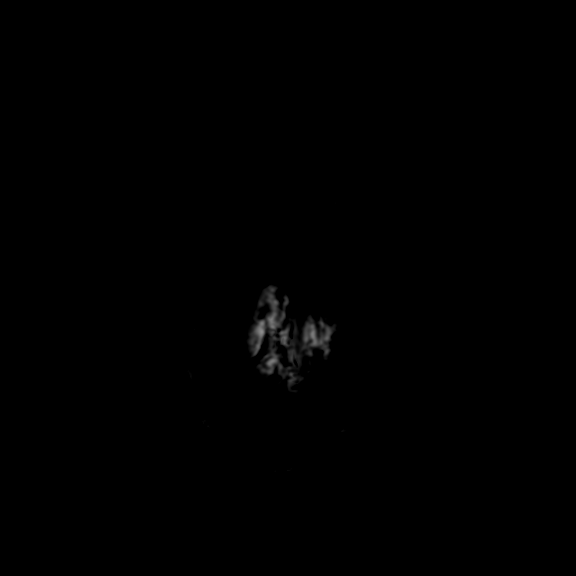

[25 of 48 positions shown; findings below may reference images not displayed]

FINDINGS: -------------------------------------------------------------------------------- 
------------------------- 
INTRACRANIAL: 
No acute ischemia. Mild small vessel disease. No abnormal foci of susceptibility 
artifact in the brain. Patency of the intracranial vascular flow voids.  No 
acute intracranial hemorrhage, mass effect, midline shift. No large sellar mass. 
No hydrocephalus. Cerebral volume is age appropriate.  
-------------------------------------------------------------------------------- 
----------------------- 
OTHER: 
ORBITS/SINUSES/T-BONES:  Visualized orbits show no acute abnormality or mass.  
Mastoid air cells and middle ear cavities are grossly clear.  Visualized 
paranasal sinuses are clear. Tornwaldt cyst incidentally noted. 
MARROW SIGNAL/SOFT TISSUES: No focal suspect signal abnormality.  
-------------------------------------------------------------------------------- 
-------------------
IMPRESSION: Mild small vessel disease. No acute infarct, intracranial hemorrhage or focal 
mass.
